# Patient Record
Sex: Female | Born: 1950 | Race: White | Hispanic: No | Marital: Married | Smoking: Never smoker
Health system: Southern US, Community
[De-identification: ages and names within clinical notes are randomized; demographics above are authoritative.]

## PROBLEM LIST (undated history)

## (undated) DIAGNOSIS — K635 Polyp of colon: Secondary | ICD-10-CM

## (undated) DIAGNOSIS — F329 Major depressive disorder, single episode, unspecified: Secondary | ICD-10-CM

## (undated) DIAGNOSIS — I2510 Atherosclerotic heart disease of native coronary artery without angina pectoris: Secondary | ICD-10-CM

## (undated) DIAGNOSIS — F32A Depression, unspecified: Secondary | ICD-10-CM

## (undated) DIAGNOSIS — G319 Degenerative disease of nervous system, unspecified: Secondary | ICD-10-CM

## (undated) DIAGNOSIS — E039 Hypothyroidism, unspecified: Secondary | ICD-10-CM

## (undated) DIAGNOSIS — R739 Hyperglycemia, unspecified: Secondary | ICD-10-CM

## (undated) DIAGNOSIS — E785 Hyperlipidemia, unspecified: Secondary | ICD-10-CM

## (undated) DIAGNOSIS — I10 Essential (primary) hypertension: Secondary | ICD-10-CM

## (undated) HISTORY — PX: CATARACT EXTRACTION: SUR2

## (undated) HISTORY — DX: Hyperlipidemia, unspecified: E78.5

## (undated) HISTORY — DX: Major depressive disorder, single episode, unspecified: F32.9

## (undated) HISTORY — DX: Hyperglycemia, unspecified: R73.9

## (undated) HISTORY — DX: Polyp of colon: K63.5

## (undated) HISTORY — DX: Depression, unspecified: F32.A

## (undated) HISTORY — DX: Atherosclerotic heart disease of native coronary artery without angina pectoris: I25.10

## (undated) HISTORY — DX: Hypothyroidism, unspecified: E03.9

## (undated) HISTORY — DX: Essential (primary) hypertension: I10

## (undated) HISTORY — DX: Degenerative disease of nervous system, unspecified: G31.9

---
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
## 1986-04-30 HISTORY — PX: NASAL SINUS SURGERY: SHX719

## 1993-04-30 HISTORY — PX: OTHER SURGICAL HISTORY: SHX169

## 2000-04-15 ENCOUNTER — Encounter: Payer: Self-pay | Admitting: Pediatrics

## 2000-04-15 ENCOUNTER — Encounter: Admission: RE | Admit: 2000-04-15 | Discharge: 2000-04-15 | Payer: Self-pay | Admitting: Pediatrics

## 2001-04-30 HISTORY — PX: CARDIAC CATHETERIZATION: SHX172

## 2001-07-30 ENCOUNTER — Ambulatory Visit (HOSPITAL_COMMUNITY): Admission: RE | Admit: 2001-07-30 | Discharge: 2001-07-30 | Payer: Self-pay | Admitting: *Deleted

## 2001-07-30 ENCOUNTER — Encounter: Payer: Self-pay | Admitting: *Deleted

## 2003-03-01 ENCOUNTER — Encounter: Admission: RE | Admit: 2003-03-01 | Discharge: 2003-03-01 | Payer: Self-pay | Admitting: Pediatrics

## 2003-05-01 DIAGNOSIS — K635 Polyp of colon: Secondary | ICD-10-CM

## 2003-05-01 HISTORY — DX: Polyp of colon: K63.5

## 2003-05-01 HISTORY — PX: CHOLECYSTECTOMY: SHX55

## 2003-11-24 ENCOUNTER — Ambulatory Visit (HOSPITAL_BASED_OUTPATIENT_CLINIC_OR_DEPARTMENT_OTHER): Admission: RE | Admit: 2003-11-24 | Discharge: 2003-11-24 | Payer: Self-pay | Admitting: Internal Medicine

## 2004-02-29 ENCOUNTER — Observation Stay (HOSPITAL_COMMUNITY): Admission: RE | Admit: 2004-02-29 | Discharge: 2004-03-01 | Payer: Self-pay

## 2004-02-29 ENCOUNTER — Encounter (INDEPENDENT_AMBULATORY_CARE_PROVIDER_SITE_OTHER): Payer: Self-pay | Admitting: *Deleted

## 2004-03-01 ENCOUNTER — Inpatient Hospital Stay (HOSPITAL_COMMUNITY): Admission: EM | Admit: 2004-03-01 | Discharge: 2004-03-02 | Payer: Self-pay | Admitting: Emergency Medicine

## 2004-04-30 HISTORY — PX: COLONOSCOPY W/ POLYPECTOMY: SHX1380

## 2004-07-17 ENCOUNTER — Ambulatory Visit: Payer: Self-pay | Admitting: Internal Medicine

## 2004-07-20 ENCOUNTER — Ambulatory Visit: Payer: Self-pay | Admitting: Internal Medicine

## 2004-11-10 ENCOUNTER — Ambulatory Visit: Payer: Self-pay | Admitting: Internal Medicine

## 2004-11-17 ENCOUNTER — Ambulatory Visit: Payer: Self-pay | Admitting: Internal Medicine

## 2004-11-22 ENCOUNTER — Encounter: Admission: RE | Admit: 2004-11-22 | Discharge: 2005-02-20 | Payer: Self-pay | Admitting: Internal Medicine

## 2005-07-24 ENCOUNTER — Ambulatory Visit: Payer: Self-pay | Admitting: Cardiology

## 2005-07-26 ENCOUNTER — Emergency Department (HOSPITAL_COMMUNITY): Admission: EM | Admit: 2005-07-26 | Discharge: 2005-07-26 | Payer: Self-pay | Admitting: Emergency Medicine

## 2005-08-09 ENCOUNTER — Encounter: Payer: Self-pay | Admitting: Cardiology

## 2005-08-09 ENCOUNTER — Ambulatory Visit: Payer: Self-pay

## 2005-08-13 ENCOUNTER — Ambulatory Visit: Payer: Self-pay | Admitting: Internal Medicine

## 2005-09-14 ENCOUNTER — Ambulatory Visit: Payer: Self-pay | Admitting: Internal Medicine

## 2005-10-01 ENCOUNTER — Ambulatory Visit: Payer: Self-pay | Admitting: Internal Medicine

## 2006-03-13 ENCOUNTER — Ambulatory Visit: Payer: Self-pay | Admitting: Internal Medicine

## 2006-03-13 LAB — CONVERTED CEMR LAB
ALT: 34 units/L (ref 0–40)
AST: 29 units/L (ref 0–37)
Albumin: 3.5 g/dL (ref 3.5–5.2)
Alkaline Phosphatase: 52 units/L (ref 39–117)
BUN: 19 mg/dL (ref 6–23)
Basophils Absolute: 0 10*3/uL (ref 0.0–0.1)
Basophils Relative: 0.3 % (ref 0.0–1.0)
Bilirubin Urine: NEGATIVE
CO2: 29 meq/L (ref 19–32)
Calcium: 9.1 mg/dL (ref 8.4–10.5)
Chloride: 107 meq/L (ref 96–112)
Chol/HDL Ratio, serum: 4.6
Cholesterol: 157 mg/dL (ref 0–200)
Creatinine, Ser: 0.9 mg/dL (ref 0.4–1.2)
Crystals: NEGATIVE
Eosinophil percent: 3.1 % (ref 0.0–5.0)
GFR calc non Af Amer: 69 mL/min
Glomerular Filtration Rate, Af Am: 84 mL/min/{1.73_m2}
Glucose, Bld: 113 mg/dL — ABNORMAL HIGH (ref 70–99)
HCT: 41.8 % (ref 36.0–46.0)
HDL: 34.1 mg/dL — ABNORMAL LOW (ref >39.0)
Hemoglobin, Urine: NEGATIVE
Hemoglobin: 13.8 g/dL (ref 12.0–15.0)
Hgb A1c MFr Bld: 5.5 % (ref 4.6–6.0)
Ketones, ur: NEGATIVE mg/dL
LDL Cholesterol: 95 mg/dL (ref 0–99)
Lymphocytes Relative: 37.1 % (ref 12.0–46.0)
MCHC: 33.1 g/dL (ref 30.0–36.0)
MCV: 88.4 fL (ref 78.0–100.0)
Monocytes Absolute: 0.5 10*3/uL (ref 0.2–0.7)
Monocytes Relative: 9.9 % (ref 3.0–11.0)
Mucus, UA: NEGATIVE
Neutro Abs: 2.4 10*3/uL (ref 1.4–7.7)
Neutrophils Relative %: 49.6 % (ref 43.0–77.0)
Nitrite: NEGATIVE
Platelets: 204 10*3/uL (ref 150–400)
Potassium: 4 meq/L (ref 3.5–5.1)
RBC / HPF: NONE SEEN
RBC: 4.73 M/uL (ref 3.87–5.11)
RDW: 13.2 % (ref 11.5–14.6)
Sodium: 141 meq/L (ref 135–145)
Specific Gravity, Urine: >=1.03 (ref 1.000–1.03)
TSH: 5.77 microintl units/mL — ABNORMAL HIGH (ref 0.35–5.50)
Total Bilirubin: 0.6 mg/dL (ref 0.3–1.2)
Total Protein, Urine: NEGATIVE mg/dL
Total Protein: 6.6 g/dL (ref 6.0–8.3)
Triglyceride fasting, serum: 138 mg/dL (ref 0–149)
Urine Glucose: NEGATIVE mg/dL
Urobilinogen, UA: 0.2 (ref 0.0–1.0)
VLDL: 28 mg/dL (ref 0–40)
WBC: 5 10*3/uL (ref 4.5–10.5)
pH: 5.5 (ref 5.0–8.0)

## 2006-04-01 ENCOUNTER — Ambulatory Visit: Payer: Self-pay | Admitting: Internal Medicine

## 2006-09-27 ENCOUNTER — Ambulatory Visit: Payer: Self-pay | Admitting: Cardiology

## 2006-10-15 ENCOUNTER — Telehealth (INDEPENDENT_AMBULATORY_CARE_PROVIDER_SITE_OTHER): Payer: Self-pay | Admitting: *Deleted

## 2006-10-25 ENCOUNTER — Ambulatory Visit: Payer: Self-pay | Admitting: Internal Medicine

## 2007-02-04 DIAGNOSIS — F329 Major depressive disorder, single episode, unspecified: Secondary | ICD-10-CM

## 2007-02-06 ENCOUNTER — Ambulatory Visit: Payer: Self-pay | Admitting: Internal Medicine

## 2007-02-06 DIAGNOSIS — I10 Essential (primary) hypertension: Secondary | ICD-10-CM | POA: Insufficient documentation

## 2007-02-06 DIAGNOSIS — I2510 Atherosclerotic heart disease of native coronary artery without angina pectoris: Secondary | ICD-10-CM | POA: Insufficient documentation

## 2007-02-06 DIAGNOSIS — E782 Mixed hyperlipidemia: Secondary | ICD-10-CM | POA: Insufficient documentation

## 2007-02-06 DIAGNOSIS — E039 Hypothyroidism, unspecified: Secondary | ICD-10-CM | POA: Insufficient documentation

## 2007-02-07 ENCOUNTER — Encounter: Payer: Self-pay | Admitting: Internal Medicine

## 2007-02-07 LAB — CONVERTED CEMR LAB
Creatinine, Urine: 251.3 mg/dL
Microalb Creat Ratio: 3.5 mg/g (ref 0.0–30.0)
Microalb, Ur: 0.88 mg/dL (ref 0.00–1.89)

## 2007-02-09 LAB — CONVERTED CEMR LAB
ALT: 61 units/L — ABNORMAL HIGH (ref 0–35)
AST: 50 units/L — ABNORMAL HIGH (ref 0–37)
Albumin: 3.9 g/dL (ref 3.5–5.2)
Alkaline Phosphatase: 64 units/L (ref 39–117)
BUN: 16 mg/dL (ref 6–23)
Basophils Absolute: 0 10*3/uL (ref 0.0–0.1)
Basophils Relative: 0.5 % (ref 0.0–1.0)
Bilirubin, Direct: 0.1 mg/dL (ref 0.0–0.3)
CO2: 29 meq/L (ref 19–32)
Calcium: 9.7 mg/dL (ref 8.4–10.5)
Chloride: 105 meq/L (ref 96–112)
Cholesterol: 142 mg/dL (ref 0–200)
Creatinine, Ser: 0.9 mg/dL (ref 0.4–1.2)
Eosinophils Absolute: 0.2 10*3/uL (ref 0.0–0.6)
Eosinophils Relative: 2.8 % (ref 0.0–5.0)
GFR calc Af Amer: 83 mL/min
GFR calc non Af Amer: 69 mL/min
Glucose, Bld: 89 mg/dL (ref 70–99)
HCT: 42 % (ref 36.0–46.0)
HDL: 33 mg/dL — ABNORMAL LOW (ref >39.0)
Hemoglobin: 14.4 g/dL (ref 12.0–15.0)
Hgb A1c MFr Bld: 5.7 % (ref 4.6–6.0)
LDL Cholesterol: 82 mg/dL (ref 0–99)
Lymphocytes Relative: 30 % (ref 12.0–46.0)
MCHC: 34.2 g/dL (ref 30.0–36.0)
MCV: 87 fL (ref 78.0–100.0)
Monocytes Absolute: 0.4 10*3/uL (ref 0.2–0.7)
Monocytes Relative: 7.5 % (ref 3.0–11.0)
Neutro Abs: 3.4 10*3/uL (ref 1.4–7.7)
Neutrophils Relative %: 59.2 % (ref 43.0–77.0)
Platelets: 237 10*3/uL (ref 150–400)
Potassium: 3.9 meq/L (ref 3.5–5.1)
RBC: 4.82 M/uL (ref 3.87–5.11)
RDW: 13.5 % (ref 11.5–14.6)
Sodium: 141 meq/L (ref 135–145)
TSH: 2.78 microintl units/mL (ref 0.35–5.50)
Total Bilirubin: 0.7 mg/dL (ref 0.3–1.2)
Total CHOL/HDL Ratio: 4.3
Total Protein: 7.5 g/dL (ref 6.0–8.3)
Triglycerides: 137 mg/dL (ref 0–149)
VLDL: 27 mg/dL (ref 0–40)
WBC: 5.7 10*3/uL (ref 4.5–10.5)

## 2007-02-10 ENCOUNTER — Encounter (INDEPENDENT_AMBULATORY_CARE_PROVIDER_SITE_OTHER): Payer: Self-pay | Admitting: *Deleted

## 2007-03-14 ENCOUNTER — Ambulatory Visit: Payer: Self-pay | Admitting: Internal Medicine

## 2007-03-14 LAB — CONVERTED CEMR LAB
ALT: 55 units/L — ABNORMAL HIGH (ref 0–35)
AST: 50 units/L — ABNORMAL HIGH (ref 0–37)
Albumin: 3.9 g/dL (ref 3.5–5.2)
Alkaline Phosphatase: 66 units/L (ref 39–117)
Bilirubin, Direct: 0.1 mg/dL (ref 0.0–0.3)
GGT: 29 units/L (ref 7–51)
Total Bilirubin: 0.6 mg/dL (ref 0.3–1.2)
Total Protein: 7.6 g/dL (ref 6.0–8.3)

## 2007-04-04 ENCOUNTER — Encounter: Payer: Self-pay | Admitting: Internal Medicine

## 2007-04-04 LAB — CONVERTED CEMR LAB
ALT: 61 units/L — ABNORMAL HIGH (ref 0–35)
AST: 64 units/L — ABNORMAL HIGH (ref 0–37)

## 2007-04-09 ENCOUNTER — Ambulatory Visit: Payer: Self-pay | Admitting: Internal Medicine

## 2007-04-09 LAB — CONVERTED CEMR LAB
ALT: 50 units/L — ABNORMAL HIGH (ref 0–35)
AST: 46 units/L — ABNORMAL HIGH (ref 0–37)
Albumin: 3.5 g/dL (ref 3.5–5.2)
Alkaline Phosphatase: 71 units/L (ref 39–117)
Bilirubin, Direct: 0.1 mg/dL (ref 0.0–0.3)
Ferritin: 22.4 ng/mL (ref 10.0–291.0)
Total Bilirubin: 0.6 mg/dL (ref 0.3–1.2)
Total Protein: 7 g/dL (ref 6.0–8.3)

## 2007-05-06 ENCOUNTER — Encounter: Payer: Self-pay | Admitting: Internal Medicine

## 2007-05-07 ENCOUNTER — Encounter: Admission: RE | Admit: 2007-05-07 | Discharge: 2007-05-07 | Payer: Self-pay | Admitting: Gastroenterology

## 2007-06-09 ENCOUNTER — Ambulatory Visit: Payer: Self-pay | Admitting: Internal Medicine

## 2007-06-19 ENCOUNTER — Encounter (INDEPENDENT_AMBULATORY_CARE_PROVIDER_SITE_OTHER): Payer: Self-pay | Admitting: *Deleted

## 2007-06-19 LAB — CONVERTED CEMR LAB
ALT: 31 units/L (ref 0–35)
AST: 29 units/L (ref 0–37)
Albumin: 3.3 g/dL — ABNORMAL LOW (ref 3.5–5.2)
Alkaline Phosphatase: 70 units/L (ref 39–117)
Bilirubin, Direct: 0.1 mg/dL (ref 0.0–0.3)
Total Bilirubin: 0.6 mg/dL (ref 0.3–1.2)
Total Protein: 6.7 g/dL (ref 6.0–8.3)

## 2007-09-09 ENCOUNTER — Ambulatory Visit: Payer: Self-pay | Admitting: Internal Medicine

## 2007-09-23 ENCOUNTER — Telehealth (INDEPENDENT_AMBULATORY_CARE_PROVIDER_SITE_OTHER): Payer: Self-pay | Admitting: *Deleted

## 2007-10-03 ENCOUNTER — Ambulatory Visit: Payer: Self-pay | Admitting: Internal Medicine

## 2007-10-27 ENCOUNTER — Ambulatory Visit: Payer: Self-pay | Admitting: Internal Medicine

## 2007-10-27 LAB — CONVERTED CEMR LAB: ALT: 51 units/L — ABNORMAL HIGH (ref 0–35)

## 2008-01-19 ENCOUNTER — Ambulatory Visit: Payer: Self-pay | Admitting: Internal Medicine

## 2008-01-19 LAB — CONVERTED CEMR LAB
ALT: 34 units/L (ref 0–35)
AST: 34 units/L (ref 0–37)

## 2008-03-22 ENCOUNTER — Ambulatory Visit: Payer: Self-pay | Admitting: Internal Medicine

## 2008-03-22 LAB — CONVERTED CEMR LAB: AST: 30 units/L (ref 0–37)

## 2008-04-29 ENCOUNTER — Ambulatory Visit: Payer: Self-pay | Admitting: Internal Medicine

## 2008-05-03 ENCOUNTER — Encounter (INDEPENDENT_AMBULATORY_CARE_PROVIDER_SITE_OTHER): Payer: Self-pay | Admitting: *Deleted

## 2008-07-27 ENCOUNTER — Telehealth (INDEPENDENT_AMBULATORY_CARE_PROVIDER_SITE_OTHER): Payer: Self-pay | Admitting: *Deleted

## 2008-09-03 ENCOUNTER — Telehealth (INDEPENDENT_AMBULATORY_CARE_PROVIDER_SITE_OTHER): Payer: Self-pay | Admitting: *Deleted

## 2008-09-29 ENCOUNTER — Telehealth (INDEPENDENT_AMBULATORY_CARE_PROVIDER_SITE_OTHER): Payer: Self-pay | Admitting: *Deleted

## 2008-09-29 ENCOUNTER — Encounter (INDEPENDENT_AMBULATORY_CARE_PROVIDER_SITE_OTHER): Payer: Self-pay | Admitting: *Deleted

## 2008-10-04 ENCOUNTER — Telehealth (INDEPENDENT_AMBULATORY_CARE_PROVIDER_SITE_OTHER): Payer: Self-pay | Admitting: *Deleted

## 2008-10-08 ENCOUNTER — Telehealth (INDEPENDENT_AMBULATORY_CARE_PROVIDER_SITE_OTHER): Payer: Self-pay | Admitting: *Deleted

## 2008-11-05 ENCOUNTER — Telehealth (INDEPENDENT_AMBULATORY_CARE_PROVIDER_SITE_OTHER): Payer: Self-pay | Admitting: *Deleted

## 2008-12-06 ENCOUNTER — Telehealth (INDEPENDENT_AMBULATORY_CARE_PROVIDER_SITE_OTHER): Payer: Self-pay | Admitting: *Deleted

## 2008-12-21 ENCOUNTER — Encounter (INDEPENDENT_AMBULATORY_CARE_PROVIDER_SITE_OTHER): Payer: Self-pay | Admitting: *Deleted

## 2008-12-23 ENCOUNTER — Ambulatory Visit: Payer: Self-pay | Admitting: Internal Medicine

## 2008-12-23 DIAGNOSIS — R7309 Other abnormal glucose: Secondary | ICD-10-CM | POA: Insufficient documentation

## 2008-12-23 DIAGNOSIS — Z860100 Personal history of colon polyps, unspecified: Secondary | ICD-10-CM | POA: Insufficient documentation

## 2009-01-04 ENCOUNTER — Encounter (INDEPENDENT_AMBULATORY_CARE_PROVIDER_SITE_OTHER): Payer: Self-pay | Admitting: *Deleted

## 2009-01-04 LAB — CONVERTED CEMR LAB
ALT: 24 units/L (ref 0–35)
AST: 27 units/L (ref 0–37)
Albumin: 4 g/dL (ref 3.5–5.2)
Alkaline Phosphatase: 59 units/L (ref 39–117)
BUN: 17 mg/dL (ref 6–23)
Basophils Absolute: 0 10*3/uL (ref 0.0–0.1)
Basophils Relative: 0.4 % (ref 0.0–3.0)
Bilirubin, Direct: 0 mg/dL (ref 0.0–0.3)
CO2: 32 meq/L (ref 19–32)
Calcium: 9.2 mg/dL (ref 8.4–10.5)
Chloride: 103 meq/L (ref 96–112)
Cholesterol: 108 mg/dL (ref 0–200)
Creatinine, Ser: 1 mg/dL (ref 0.4–1.2)
Eosinophils Absolute: 0.1 10*3/uL (ref 0.0–0.7)
Eosinophils Relative: 2.5 % (ref 0.0–5.0)
GFR calc non Af Amer: 60.54 mL/min (ref >60)
Glucose, Bld: 92 mg/dL (ref 70–99)
HCT: 43.9 % (ref 36.0–46.0)
HDL: 34.7 mg/dL — ABNORMAL LOW (ref >39.00)
Hemoglobin: 15 g/dL (ref 12.0–15.0)
Hgb A1c MFr Bld: 5.7 % (ref 4.6–6.5)
LDL Cholesterol: 50 mg/dL (ref 0–99)
Lymphocytes Relative: 38.4 % (ref 12.0–46.0)
Lymphs Abs: 1.9 10*3/uL (ref 0.7–4.0)
MCHC: 34.1 g/dL (ref 30.0–36.0)
MCV: 89.1 fL (ref 78.0–100.0)
Monocytes Absolute: 0.3 10*3/uL (ref 0.1–1.0)
Monocytes Relative: 6.6 % (ref 3.0–12.0)
Neutro Abs: 2.6 10*3/uL (ref 1.4–7.7)
Neutrophils Relative %: 52.1 % (ref 43.0–77.0)
Platelets: 179 10*3/uL (ref 150.0–400.0)
Potassium: 3.8 meq/L (ref 3.5–5.1)
RBC: 4.92 M/uL (ref 3.87–5.11)
RDW: 13 % (ref 11.5–14.6)
Sodium: 140 meq/L (ref 135–145)
TSH: 2.17 microintl units/mL (ref 0.35–5.50)
Total Bilirubin: 0.8 mg/dL (ref 0.3–1.2)
Total CHOL/HDL Ratio: 3
Total Protein: 7.5 g/dL (ref 6.0–8.3)
Triglycerides: 118 mg/dL (ref 0.0–149.0)
VLDL: 23.6 mg/dL (ref 0.0–40.0)
WBC: 4.9 10*3/uL (ref 4.5–10.5)

## 2009-02-21 ENCOUNTER — Ambulatory Visit: Payer: Self-pay | Admitting: Internal Medicine

## 2009-03-16 ENCOUNTER — Encounter: Payer: Self-pay | Admitting: Family Medicine

## 2009-03-22 ENCOUNTER — Encounter: Payer: Self-pay | Admitting: Family Medicine

## 2009-03-22 LAB — CONVERTED CEMR LAB: Tissue Transglutaminase Ab, IgA: 0.5 units (ref <7)

## 2009-08-30 ENCOUNTER — Telehealth: Payer: Self-pay | Admitting: Internal Medicine

## 2010-03-27 ENCOUNTER — Telehealth (INDEPENDENT_AMBULATORY_CARE_PROVIDER_SITE_OTHER): Payer: Self-pay | Admitting: *Deleted

## 2010-05-01 ENCOUNTER — Emergency Department (HOSPITAL_COMMUNITY)
Admission: EM | Admit: 2010-05-01 | Discharge: 2010-05-01 | Payer: Self-pay | Source: Home / Self Care | Admitting: Family Medicine

## 2010-05-19 ENCOUNTER — Ambulatory Visit
Admission: RE | Admit: 2010-05-19 | Discharge: 2010-05-19 | Payer: Self-pay | Source: Home / Self Care | Attending: Internal Medicine | Admitting: Internal Medicine

## 2010-05-24 ENCOUNTER — Telehealth: Payer: Self-pay | Admitting: Internal Medicine

## 2010-05-29 ENCOUNTER — Encounter: Payer: Self-pay | Admitting: Internal Medicine

## 2010-05-30 NOTE — Progress Notes (Signed)
Summary: Dizzy,heart flutter   Phone Note Call from Patient Call back at Work Phone [phone]   Caller: Patient Summary of Call: Pt dizzy having heart flutter. Initial call taken by: Judie Grieve,  Aug 30, 2009 8:44 AM  Follow-up for Phone Call        Had pt check BP & HR.  Result  101/66,  HR 71.  Described being dizzy and feeling her heart flutter this morning when she got up as well as her chest felt weak but no pain; had the dizziness & heart flutter about a week ago for first time.  Did not feel heart flutter during our conversation but did feel dizzy.  Also has been having SOB more than usual for about a month now.  She's lost approx. 25 pounds recently.  Stated she's been eating and drinking enough. Told her to keep an eye on it and call us back towards the end of the week.  Keep taking BP & record it.  Call us if symptoms persist or worsen.  She verbalized understanding. Follow-up by: Minerva Areola, RN, BSN,  Aug 30, 2009 9:29 AM     Appended Document: Dizzy,heart flutter Patient seen in october.  It may be time for return. If weight loss was not intentional she should see primary  Appended Document: Dizzy,heart flutter Called patient .. she.advised me that  she saw her md at work and she had fluid in her ears which she states they took care of and now she has no complaints. Her wight is 210 lbs which is the same that it was in August of 2010. She is in call backs for the fall and will let me know if she needed to be seen sooner. No current complaints of dizziness or palps. Will let Dr. Tenny Craw know.

## 2010-05-30 NOTE — Progress Notes (Signed)
Summary: refill  Phone Note Refill Request Call back at Work Phone [phone] Message from:  Patient  Refills Requested: Medication #1:  BENAZEPRIL HCL 5 MG  TABS **APPOINTMENT DUE** 1 by mouth once daily  Medication #2:  FUROSEMIDE 40 MG  TABS **APPOINTMENT DUE**1 by mouth once daily  Medication #3:  LEVOTHYROXINE SODIUM 25 MCG  TABS 1 by mouth once daily **APPOINTMENT DUE**  Medication #4:  METOPROLOL TARTRATE 100 MG  TABS 1/4 tab qd [facility] -patient changing pharmacy -patient wants 90 day supply -    Next Appointment Scheduled: [id number] cpx Initial call taken by: Okey Regal [place],  March 27, 2010 10:01 AM    Prescriptions: METOPROLOL TARTRATE 100 MG  TABS (METOPROLOL TARTRATE) 1/4 tab qd  #90 x 0   Entered by:   Shonna Chock CMA   Authorized by:   Marga Melnick MD   Signed by:   Shonna Chock CMA on 03/27/2010   Method used:   Electronically to        [facility]  [phone]* (retail)       [address]       [place], [place]  [address]       Ph: [phone] or [phone]       Fax: [phone]   RxID:   [id number] LEVOTHYROXINE SODIUM 25 MCG  TABS (LEVOTHYROXINE SODIUM) 1 by mouth once daily **APPOINTMENT DUE**  #90 x 0   Entered by:   Shonna Chock CMA   Authorized by:   Marga Melnick MD   Signed by:   Shonna Chock CMA on 03/27/2010   Method used:   Electronically to        [facility]  [phone]* (retail)       [address]       [place], [place]  [address]       Ph: [phone] or [phone]       Fax: [phone]   RxID:   [id number] FUROSEMIDE 40 MG  TABS (FUROSEMIDE) **APPOINTMENT DUE**1 by mouth once daily  #90 x 0   Entered by:   Shonna Chock CMA   Authorized by:   Marga Melnick MD   Signed by:   Shonna Chock CMA on 03/27/2010   Method used:   Electronically to        [facility]  [phone]* (retail)       [address]       [place], [place]  [address]       Ph: [phone] or [phone]       Fax: [phone]   RxID:   [id number] BENAZEPRIL HCL 5 MG  TABS (BENAZEPRIL HCL) **APPOINTMENT DUE** 1 by mouth once daily  #90 x 0   Entered by:   Shonna Chock CMA   Authorized by:   Marga Melnick MD   Signed by:   Shonna Chock CMA on 03/27/2010   Method used:   Electronically to        [facility]  [phone]* (retail)       [address]       [place], [place]  [address]       Ph: [phone] or [phone]       Fax: [phone]   RxID:   [id number]

## 2010-06-01 NOTE — Assessment & Plan Note (Signed)
Summary: f1y  Medications Added CITALOPRAM HYDROBROMIDE 40 MG  TABS (CITALOPRAM HYDROBROMIDE) 1 tab daily      Allergies Added:   Visit Type:  Follow-up Primary Provider:  Marga Melnick MD  CC:  no complaints.  History of Present Illness: Ms. Kathleen Sellers is a 60 year old woman with a history of CAD(cath 2003:  50 to 60% LAD; 60% OM2; 20% RCA),  dyslipidemia, hypothyroidism, hypertension, I last saw her in October 2010.  At that time she was doing well.  Lipid panel in Aug 2010 LDL was 50, HDL was 35. Since seen she has done well from a cardiac stanpoint. She denies SOB, CP, Dizziness, palptitations. She hurt her foot a couple wks ago.  Note her mother died this fall and a friend died at thanksgiving.  Problems Prior to Update: 1)  Routine General Medical Exam@health  Care Facl  (ICD-V70.0) 2)  Fasting Hyperglycemia  (ICD-790.29) 3)  Colonic Polyps, Hx of  (ICD-V12.72) 4)  C A D  (ICD-414.00) 5)  Hypertension, Essential Nos  (ICD-401.9) 6)  Hyperlipidemia  (ICD-272.2) 7)  Hypothyroidism  (ICD-244.9) 8)  Family History Diabetes 1st Degree Relative  (ICD-V18.0) 9)  Obsessive-compulsive Disorder  (ICD-300.3) 10)  Depression  (ICD-311)  Current Medications (verified): 1)  Lorazepam 1 Mg  Tabs (Lorazepam) .Marland Kitchen.. 1 By Mouth Bid 2)  Benazepril Hcl 5 Mg  Tabs (Benazepril Hcl) .... **appointment Due** 1 By Mouth Once Daily 3)  Citalopram Hydrobromide 40 Mg  Tabs (Citalopram Hydrobromide) .Marland Kitchen.. 1 Tab Daily 4)  Levothyroxine Sodium 25 Mcg  Tabs (Levothyroxine Sodium) .Marland Kitchen.. 1 By Mouth Once Daily **appointment Due** 5)  Furosemide 40 Mg  Tabs (Furosemide) .... **appointment Due**1 By Mouth Once Daily 6)  Metoprolol Tartrate 100 Mg  Tabs (Metoprolol Tartrate) .... 1/4 Tab Qd 7)  Omeprazole 20 Mg  Tbec (Omeprazole) .Marland Kitchen.. 1 By Mouth Qd 8)  Crestor 40 Mg Tabs (Rosuvastatin Calcium) .... Once Daily  Allergies (verified): 1)  ! Sulfa 2)  ! Septra 3)  ! * Raniclor  Past History:  Family  History: Last updated: 2009/01/04 Father: died of cerebral  hemorrhage Mother:   DM, HTN,bone CA Paternal uncles with CAD; MGM breast CA  Social History: Last updated: January 04, 2009 restricts "junk" Married.  3 kids No Tobacco.  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  Past Medical History: Reviewed history from Jan 04, 2009 and no changes required. Fasting hyperglycemia (790.29) C A D (ICD-414.00) @cath  2003 HYPERTENSION, ESSENTIAL NOS (ICD-401.9) HYPOTHYROIDISM (ICD-244.9) HYPERLIPIDEMIA (ICD-272.2) OBSESSIVE-COMPULSIVE DISORDER (ICD-300.3) HYPERLIPIDEMIA (ICD-272.4) DEPRESSION (ICD-311), Dr Nolen Mu Colonic polyps, hx of  Past Surgical History: Reviewed history from Jan 04, 2009 and no changes required. Oophorectomy /benign tumor 1970 Appendectomy Cataract extraction bilaterally Cholecystectomy 2005 Colon polypectomy 2006, Dr Evette Cristal Hysterectomy ,dysfunctional menses 1995 Sinus surgery 1988  Family History: Reviewed history from 01-04-2009 and no changes required. Father: died of cerebral  hemorrhage Mother:   DM, HTN,bone CA Paternal uncles with CAD; MGM breast CA  Social History: Reviewed history from 01-04-2009 and no changes required. restricts "junk" Married.  3 kids No Tobacco.  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  ALl systems neg to the above problem except as noted above  Vital Signs:  Patient profile:   60 year old female Height:  64 inches Weight:      212.75 pounds BMI:     36.65 Pulse rate:   50 / minute BP sitting:   106 / 66  (left arm) Cuff size:   regular  Vitals Entered By: Micki Riley CNA (May 19, 2010 1:54  PM)  Physical Exam  Additional Exam:  Patient is in NAD HEENT:  Normocephalic, atraumatic. EOMI, PERRLA.  Neck: JVP is normal. No thyromegaly. No bruits.  Lungs: clear to auscultation. No rales no wheezes.  Heart: Regular rate and rhythm. Normal S1, S2. No S3.   No significant murmurs. PMI not displaced.  Abdomen:  Supple, nontender. Normal bowel sounds. No masses. No hepatomegaly.  Extremities:   Good distal pulses throughout. No lower extremity edema. R foot in boot, bandaged. Musculoskeletal :moving all extremities.  Neuro:   alert and oriented x3.    Impression & Recommendations:  Problem # 1:  C A D (ICD-414.00) No symptoms to suggest angina.  NO change in meds.  Problem # 2:  HYPERTENSION, ESSENTIAL NOS (ICD-401.9) adequate control Patient will have labs in a few wks.  Problem # 3:  HYPERLIPIDEMIA (ICD-272.2) Patient will have labs when she sees [facility].  Told her to forward them here. Encouraged increased exercise. Her updated medication list for this problem includes:    Crestor 40 Mg Tabs (Rosuvastatin calcium) ..... Once daily  Problem # 4:  HYPOTHYROIDISM (ICD-244.9) Will get labs. Her updated medication list for this problem includes:    Levothyroxine Sodium 25 Mcg Tabs (Levothyroxine sodium) .Marland Kitchen... 1 by mouth once daily **appointment due**  Patient Instructions: 1)  Continue current meds 2)  Increase excercise 3)  F/U in 1 year. 4)  Will review labs when you have them done.

## 2010-06-01 NOTE — Progress Notes (Signed)
Summary: CPX Labs  Phone Note Call from Patient Call back at [phone]   Caller: Patient Summary of Call: Pt is requesting to have cpx labs done at [facility].  Pt has appt for cpx 2/3 and would like to have the labs done there prior to cpx visit. Please contact patient at [phone] w/orders or instructions on what she needs to do. Initial call taken by: Lavell Islam,  May 24, 2010 11:09 AM  Follow-up for Phone Call        Do you want cbc diff, bmet, tsh, vit d ? dx v70.0. Lucious Groves CMA  May 24, 2010 11:36 AM   Additional Follow-up for Phone Call Additional follow up Details #1::        V70.0, 272.4, 244.9, 790.29 ( lipids, BMET, TSH, CBC& dif, A1c, hepatic panel).  Additional Follow-up by: Marga Melnick MD,  May 25, 2010 10:05 AM    Additional Follow-up for Phone Call Additional follow up Details #2::    Patient notified and will call back with a  fax # for me. Lucious Groves CMA  May 25, 2010 2:36 PM   Faxed to [facility] at [phone]. Lucious Groves CMA  May 25, 2010 2:52 PM   New/Updated Medications: * LABS TO BE DRAWN Fasting-- lipids, BMET, TSH, CBC& dif, A1c, hepatic panel  dx: V70.0, 272.4, 244.9, 790.29 Prescriptions: LABS TO BE DRAWN Fasting-- lipids, BMET, TSH, CBC& dif, A1c, hepatic panel  dx: V70.0, 272.4, 244.9, 790.29  #1 x 0   Entered by:   Lucious Groves CMA   Authorized by:   Marga Melnick MD   Signed by:   Lucious Groves CMA on 05/25/2010   Method used:   Print then Give to Patient   RxID:   [id number]

## 2010-06-02 ENCOUNTER — Encounter: Payer: Self-pay | Admitting: Internal Medicine

## 2010-06-02 ENCOUNTER — Ambulatory Visit: Admit: 2010-06-02 | Payer: Self-pay | Admitting: Internal Medicine

## 2010-06-02 ENCOUNTER — Encounter (INDEPENDENT_AMBULATORY_CARE_PROVIDER_SITE_OTHER): Payer: Commercial Managed Care - PPO | Admitting: Internal Medicine

## 2010-06-02 DIAGNOSIS — E039 Hypothyroidism, unspecified: Secondary | ICD-10-CM

## 2010-06-02 DIAGNOSIS — I2510 Atherosclerotic heart disease of native coronary artery without angina pectoris: Secondary | ICD-10-CM

## 2010-06-02 DIAGNOSIS — Z136 Encounter for screening for cardiovascular disorders: Secondary | ICD-10-CM

## 2010-06-02 DIAGNOSIS — J019 Acute sinusitis, unspecified: Secondary | ICD-10-CM | POA: Insufficient documentation

## 2010-06-02 DIAGNOSIS — E782 Mixed hyperlipidemia: Secondary | ICD-10-CM

## 2010-06-02 DIAGNOSIS — Z0000 Encounter for general adult medical examination without abnormal findings: Secondary | ICD-10-CM

## 2010-06-02 DIAGNOSIS — I10 Essential (primary) hypertension: Secondary | ICD-10-CM

## 2010-06-02 DIAGNOSIS — Z86010 Personal history of colonic polyps: Secondary | ICD-10-CM

## 2010-06-07 NOTE — Assessment & Plan Note (Signed)
Summary: cpx/kn-----pt says she got phone call to move appt to 9am, bu...   Vital Signs:  Patient profile:   60 year old female Height:      64 inches Weight:      214.0 pounds BMI:     36.87 Temp:     98.6 degrees F oral Pulse rate:   72 / minute Resp:     14 per minute BP sitting:   118 / 70  (left arm) Cuff size:   large  Vitals Entered By: Shonna Chock CMA (June 02, 2010 9:12 AM)  CC: CPX and discuss labs (copy given), General Medical Evaluation, URI symptoms, Heartburn   Primary Care Provider:  Marga Melnick MD  CC:  CPX and discuss labs (copy given), General Medical Evaluation, URI symptoms, and Heartburn.  History of Present Illness:    Kathleen Sellers is here for a physical; she has had  active URI symptoms X 2 weeks.She now  reports nasal congestion and purulent nasal discharge, but denies productive cough and earache.  The patient denies fever, dyspnea, and wheezing.  The patient denies frontal  headache, bilateral facial pain, tooth pain, and tender adenopathy.  Rx: none.    Hyperlipidemia Follow-Up: The patient denies muscle aches, GI upset, abdominal pain, flushing, itching, constipation,  & diarrhea..  Other symptoms include palpitations( occasional fluttering @ rest).  The patient denies the following symptoms: chest pain/pressure, dypsnea, syncope, and pedal edema.  Compliance with medications (by patient report) has been near 100%.  Dietary compliance has been poor.  Adjunctive measures currently used by the patient include fiber and ASA.  LDL is 54.     Hypertension Follow-Up:   The patient denies lightheadedness, urinary frequency, and headaches.  Compliance with medications (by patient report) has been near 100%.  Adjunctive measures currently used by the patient include salt restriction.  BP averages 120/80.     GERD:  The patient denies acid reflux, sour taste in mouth, epigastric pain, trouble swallowing, weight loss, and weight gain if she takes the PPI.  The  patient denies the following alarm features: melena, dysphagia, hematemesis, and vomiting.  Symptoms are worse with spicy foods.  The patient has found the following treatments to be effective: a PPI.    Current Medications (verified): 1)  Lorazepam 1 Mg  Tabs (Lorazepam) .Marland Kitchen.. 1 By Mouth Bid 2)  Benazepril Hcl 5 Mg  Tabs (Benazepril Hcl) .Marland Kitchen.. 1 By Mouth Once Daily 3)  Citalopram Hydrobromide 40 Mg  Tabs (Citalopram Hydrobromide) .Marland Kitchen.. 1 Tab Daily 4)  Levothyroxine Sodium 25 Mcg  Tabs (Levothyroxine Sodium) .Marland Kitchen.. 1 By Mouth Once Daily 5)  Furosemide 40 Mg  Tabs (Furosemide) .Marland Kitchen.. 1 By Mouth Once Daily 6)  Metoprolol Tartrate 100 Mg  Tabs (Metoprolol Tartrate) .... 1/4 Tab Qd 7)  Omeprazole 20 Mg  Tbec (Omeprazole) .Marland Kitchen.. 1 By Mouth Qd 8)  Crestor 40 Mg Tabs (Rosuvastatin Calcium) .... Once Daily  Allergies: 1)  ! Sulfa 2)  ! Septra 3)  ! * Raniclor  Past History:  Past Medical History: Fasting hyperglycemia (790.29), PMH of  C A D (ICD-414.00) @ catheterization  2003 HYPERTENSION, ESSENTIAL NOS (ICD-401.9) HYPOTHYROIDISM (ICD-244.9) HYPERLIPIDEMIA (ICD-272.2) DEPRESSION (ICD-311), Dr Nolen Mu Colonic polyps, PMH  of  Past Surgical History: Oophorectomy  for benign tumor 1970 Appendectomy Cataract extraction bilaterally Cholecystectomy 2005 Colon polypectomy 2006, Dr Jeralene Peters; ? was due 2011 Hysterectomy & USO  ,dysfunctional menses 1995 Sinus surgery 1988, Dr Haroldine Laws  Family History: Father: died of cerebral  hemorrhage Mother:   DM, HTN, lung cacer with bone  metastases  Paternal uncles: CAD; MGM: breast cancer  Social History: Married.  3  children No Tobacco.  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 from lack of exercise. Eyes:  Denies blurring, double vision, and vision loss-both eyes. ENT:  Denies hoarseness. GU:  Denies discharge, dysuria, and hematuria. MS:   Complains of muscle aches; denies joint pain, joint redness, and joint swelling. Derm:  Denies changes in nail beds, dryness, and hair loss. Neuro:  Complains of numbness; denies tingling; Intermittent numbness 3rd-5th L fingers. Psych:  Complains of easily angered and easily tearful; denies anxiety, depression, and irritability; her motherf died in 2010/01/30. Endo:  Complains of heat intolerance; denies cold intolerance, excessive hunger, excessive thirst, and excessive urination. Heme:  Denies abnormal bruising and bleeding. Allergy:  Denies itching eyes and sneezing.  Physical Exam  General:  well-nourished,in no acute distress; alert,appropriate and cooperative throughout examination Head:  Normocephalic and atraumatic ; small subcutaneous inclusion cyst over crown Eyes:  No corneal or conjunctival inflammation noted. Perrla. Funduscopic exam benign, without hemorrhages, exudates or papilledema.  Ears:  External ear exam shows no significant lesions or deformities.  Otoscopic examination reveals clear canals, tympanic membranes are intact bilaterally without bulging, retraction, inflammation or discharge. Hearing is grossly normal bilaterally. Nose:  External nasal examination shows no deformity or inflammation. Nasal mucosa are pink and moist without lesions or exudates. Mouth:  Oral mucosa and oropharynx without lesions or exudates.  Teeth in good repair. Neck:  No deformities, masses, or tenderness noted. Breasts:  Mammograms overdue, risk discussed Lungs:  Normal respiratory effort, chest expands symmetrically. Lungs are clear to auscultation, no crackles or wheezes. Heart:  normal rate, regular rhythm, no gallop, no rub, no JVD, no HJR, and grade 1/2  /6 systolic murmur @ R  base.   Abdomen:  Bowel sounds positive,abdomen soft and non-tender without masses, organomegaly or hernias noted. Genitalia:  Dr Elana Alm Msk:  No deformity or scoliosis noted of thoracic or lumbar spine.   Pulses:   R and L carotid,radial,dorsalis pedis and posterior tibial pulses are full and equal bilaterally Extremities:  No clubbing, cyanosis, edema, or deformity noted with normal full range of motion of all joints.   Neurologic:  alert & oriented X3 and DTRs symmetrical and normal.   Skin:  Intact without suspicious lesions or rashes Cervical Nodes:  No lymphadenopathy noted Axillary Nodes:  No palpable lymphadenopathy Psych:  memory intact for recent and remote, normally interactive, and good eye contact.     Impression & Recommendations:  Problem # 1:  ROUTINE GENERAL MEDICAL EXAM@HEALTH  CARE FACL (ICD-V70.0)  Orders: EKG w/ Interpretation (93000)  Problem # 2:  SINUSITIS- ACUTE-NOS (ICD-461.9)  Her updated medication list for this problem includes:    Amoxicillin 500 Mg Caps (Amoxicillin) .Marland Kitchen... 1 three times a day  Problem # 3:  C A D (ICD-414.00) as per Dr Tenny Craw Her updated medication list for this problem includes:    Benazepril Hcl 5 Mg Tabs (Benazepril hcl) .Marland Kitchen... 1 by mouth once daily    Furosemide 40 Mg Tabs (Furosemide) .Marland Kitchen... 1 by mouth once daily    Metoprolol Tartrate 100 Mg Tabs (Metoprolol tartrate) .Marland Kitchen... 1/4 tab qd  Problem # 4:  HYPERTENSION, ESSENTIAL NOS (ICD-401.9) controlled Her updated medication list for this problem includes:    Benazepril Hcl 5 Mg Tabs (Benazepril hcl) .Marland KitchenMarland KitchenMarland KitchenMarland Kitchen  1 by mouth once daily    Furosemide 40 Mg Tabs (Furosemide) .Marland Kitchen... 1 by mouth once daily    Metoprolol Tartrate 100 Mg Tabs (Metoprolol tartrate) .Marland Kitchen... 1/4 tab qd  Problem # 5:  HYPERLIPIDEMIA (ICD-272.2)  Her updated medication list for this problem includes:    Crestor 40 Mg Tabs (Rosuvastatin calcium) ..... Once daily  Problem # 6:  HYPOTHYROIDISM (ICD-244.9)  Her updated medication list for this problem includes:    Levothyroxine Sodium 25 Mcg Tabs (Levothyroxine sodium) .Marland Kitchen... 1 by mouth once daily  Complete Medication List: 1)  Lorazepam 1 Mg Tabs (Lorazepam) .Marland Kitchen.. 1 by mouth  bid 2)  Benazepril Hcl 5 Mg Tabs (Benazepril hcl) .Marland Kitchen.. 1 by mouth once daily 3)  Citalopram Hydrobromide 40 Mg Tabs (Citalopram hydrobromide) .Marland Kitchen.. 1 tab daily 4)  Levothyroxine Sodium 25 Mcg Tabs (Levothyroxine sodium) .Marland Kitchen.. 1 by mouth once daily 5)  Furosemide 40 Mg Tabs (Furosemide) .Marland Kitchen.. 1 by mouth once daily 6)  Metoprolol Tartrate 100 Mg Tabs (Metoprolol tartrate) .... 1/4 tab qd 7)  Omeprazole 20 Mg Tbec (Omeprazole) .Marland Kitchen.. 1 by mouth qd 8)  Crestor 40 Mg Tabs (Rosuvastatin calcium) .... Once daily 9)  Amoxicillin 500 Mg Caps (Amoxicillin) .Marland Kitchen.. 1 three times a day  Patient Instructions: 1)  Avoid foods high in acid (tomatoes, citrus juices, spicy foods). Avoid eating within two hours of lying down or before exercising. Do not over eat; try smaller more frequent meals. Elevate head of bed twelve inches when sleeping. 2)  It is important that you exercise regularly at least 20 minutes 5 times a week. If you develop chest pain, have severe difficulty breathing, or feel very tired , stop exercising immediately and seek medical attention. Begin a graduated program over 6-8 weeks. 3)  Schedule your mammogram. 4)  Schedule a colonoscopy  to help detect colon cancer if due as per Dr Jeralene Peters. 5)  Take an  81 mg coated Aspirin every day. 6)  Drink as much NON dairy fluid as you can tolerate for the next few days. Neti pot once daily as needed. Prescriptions: AMOXICILLIN 500 MG CAPS (AMOXICILLIN) 1 three times a day  #30 x 0   Entered and Authorized by:   Marga Melnick MD   Signed by:   Marga Melnick MD on 06/02/2010   Method used:   Electronically to        [facility]* (retail)       [address] [place], [place]  [address]       Ph: [phone]       Fax: [phone]   RxID:   [id number]    Orders Added: 1)  Est. Patient 40-64 years [99396] 2)  EKG w/ Interpretation [93000]

## 2010-06-21 ENCOUNTER — Telehealth: Payer: Self-pay | Admitting: Internal Medicine

## 2010-06-27 ENCOUNTER — Telehealth: Payer: Self-pay | Admitting: Internal Medicine

## 2010-06-27 ENCOUNTER — Emergency Department (HOSPITAL_COMMUNITY)
Admission: EM | Admit: 2010-06-27 | Discharge: 2010-06-28 | Disposition: A | Discharge status: Home / Self Care | Payer: Commercial Managed Care - PPO | Attending: Emergency Medicine | Admitting: Emergency Medicine

## 2010-06-27 ENCOUNTER — Encounter: Payer: Self-pay | Admitting: Nurse Practitioner

## 2010-06-27 ENCOUNTER — Telehealth (INDEPENDENT_AMBULATORY_CARE_PROVIDER_SITE_OTHER): Payer: Self-pay | Admitting: *Deleted

## 2010-06-27 DIAGNOSIS — R0789 Other chest pain: Secondary | ICD-10-CM | POA: Insufficient documentation

## 2010-06-27 DIAGNOSIS — K219 Gastro-esophageal reflux disease without esophagitis: Secondary | ICD-10-CM | POA: Insufficient documentation

## 2010-06-27 DIAGNOSIS — I10 Essential (primary) hypertension: Secondary | ICD-10-CM | POA: Insufficient documentation

## 2010-06-27 DIAGNOSIS — Z79899 Other long term (current) drug therapy: Secondary | ICD-10-CM | POA: Insufficient documentation

## 2010-06-27 DIAGNOSIS — E789 Disorder of lipoprotein metabolism, unspecified: Secondary | ICD-10-CM | POA: Insufficient documentation

## 2010-06-27 DIAGNOSIS — R002 Palpitations: Secondary | ICD-10-CM | POA: Insufficient documentation

## 2010-06-27 DIAGNOSIS — F341 Dysthymic disorder: Secondary | ICD-10-CM | POA: Insufficient documentation

## 2010-06-27 LAB — BASIC METABOLIC PANEL
BUN: 11 mg/dL (ref 6–23)
Chloride: 99 mEq/L (ref 96–112)
Creatinine, Ser: 0.88 mg/dL (ref 0.4–1.2)
GFR calc Af Amer: >60 mL/min (ref >60)
GFR calc non Af Amer: >60 mL/min (ref >60)
Glucose, Bld: 105 mg/dL — ABNORMAL HIGH (ref 70–99)
Potassium: 4.3 mEq/L (ref 3.5–5.1)
Sodium: 134 mEq/L — ABNORMAL LOW (ref 135–145)

## 2010-06-27 LAB — DIFFERENTIAL
Basophils Absolute: 0 10*3/uL (ref 0.0–0.1)
Basophils Relative: 0 % (ref 0–1)
Eosinophils Absolute: 0.3 10*3/uL (ref 0.0–0.7)
Eosinophils Relative: 5 % (ref 0–5)
Lymphocytes Relative: 30 % (ref 12–46)
Monocytes Absolute: 0.5 10*3/uL (ref 0.1–1.0)
Monocytes Relative: 8 % (ref 3–12)
Neutro Abs: 3.6 10*3/uL (ref 1.7–7.7)

## 2010-06-27 LAB — POCT CARDIAC MARKERS
CKMB, poc: <1 ng/mL — ABNORMAL LOW (ref 1.0–8.0)
Myoglobin, poc: 82.3 ng/mL (ref 12–200)

## 2010-06-27 LAB — CBC
HCT: 43.1 % (ref 36.0–46.0)
MCH: 29.1 pg (ref 26.0–34.0)
MCV: 85.3 fL (ref 78.0–100.0)
Platelets: 173 10*3/uL (ref 150–400)
RBC: 5.05 MIL/uL (ref 3.87–5.11)
RDW: 13.3 % (ref 11.5–15.5)
WBC: 6.3 10*3/uL (ref 4.0–10.5)

## 2010-06-27 NOTE — Progress Notes (Signed)
Summary: DME order for CPAP supplies  Phone Note Call from Patient   Summary of Call: Patient called noting that she needs new mask for her machine. She has spoken with [facility], and was told that Hop will have to send order for "cpap medical supplies" to the at fax [phone]. Ok to enter DME?  Lucious Groves CMA  June 21, 2010 9:07 AM   Follow-up for Phone Call        who nordered her CPAP equipment; usually it is ordered by a Sleep Specialist who performed  & read studies Follow-up by: Marga Melnick MD,  June 21, 2010 11:28 AM  Additional Follow-up for Phone Call Additional follow up Details #1::        Patient notes that she had sleep apnea test years ago and it was previously given by Alwyn Ren and brought out to her by [facility]. Additional Follow-up by: Lucious Groves CMA,  June 21, 2010 11:41 AM    Additional Follow-up for Phone Call Additional follow up Details #2::    OK ;renew by order Follow-up by: Marga Melnick MD,  June 21, 2010 3:02 PM  Additional Follow-up for Phone Call Additional follow up Details #3:: Details for Additional Follow-up Action Taken: Done, thanks. Additional Follow-up by: Lucious Groves CMA,  June 21, 2010 3:11 PM

## 2010-06-28 ENCOUNTER — Encounter (INDEPENDENT_AMBULATORY_CARE_PROVIDER_SITE_OTHER): Payer: Commercial Managed Care - PPO

## 2010-06-28 ENCOUNTER — Telehealth: Payer: Self-pay | Admitting: Internal Medicine

## 2010-06-28 ENCOUNTER — Emergency Department (HOSPITAL_COMMUNITY): Payer: Commercial Managed Care - PPO

## 2010-06-28 DIAGNOSIS — R002 Palpitations: Secondary | ICD-10-CM

## 2010-06-28 LAB — POCT CARDIAC MARKERS
CKMB, poc: <1 ng/mL — ABNORMAL LOW (ref 1.0–8.0)
Myoglobin, poc: 88 ng/mL (ref 12–200)

## 2010-06-28 LAB — TROPONIN I: Troponin I: 0.01 ng/mL (ref 0.00–0.06)

## 2010-06-28 LAB — CK TOTAL AND CKMB (NOT AT ARMC)
CK, MB: 1 ng/mL (ref 0.3–4.0)
Relative Index: INVALID (ref 0.0–2.5)
Total CK: 83 U/L (ref 7–177)

## 2010-07-03 ENCOUNTER — Encounter: Payer: Self-pay | Admitting: Nurse Practitioner

## 2010-07-03 ENCOUNTER — Ambulatory Visit (INDEPENDENT_AMBULATORY_CARE_PROVIDER_SITE_OTHER): Payer: Commercial Managed Care - PPO | Admitting: Nurse Practitioner

## 2010-07-03 ENCOUNTER — Ambulatory Visit: Payer: Commercial Managed Care - PPO | Admitting: Physician Assistant

## 2010-07-03 DIAGNOSIS — R002 Palpitations: Secondary | ICD-10-CM | POA: Insufficient documentation

## 2010-07-06 NOTE — Progress Notes (Signed)
Summary: sob and afib   Phone Note Call from Patient Call back at Home Phone [phone] Call back at ext [phone]   Caller: Patient Summary of Call: pt states she in afib and sob. pt been dizzy as well. Initial call taken by: Roe Coombs,  June 27, 2010 4:37 PM  Follow-up for Phone Call        Pt. c/o of being in a-fib. She feels her heart beating fast. also she c/o of dizziness. She denies SOB. Pt. states she has a head cold and the dizziness could come from that. B/P 113/69. Pt. said that an EKG was done at Dr. Frederik Pear office where it shows that she has a prolonged QT. The ekg was faxed to Dr. Tenny Craw. Pt.  does not want to go to the ER. I let pt. know that it is too late for her to be seen in the office.  Adviced pt. that if symptoms do not improve or get worse, she needs to call the on call person or go to Er. Pt. verbalized understanding Follow-up by: Ollen Gross, RN, BSN,  June 27, 2010 4:56 PM

## 2010-07-06 NOTE — Progress Notes (Signed)
Summary: Cardiology Phone Note - Heart Fluttering/Dizziness   Phone Note Call from Patient   Caller: Patient Summary of Call: Pt called to c/o palpitations, sob, and lightheadedness. She feels particularly dizzy when she stands up. She notes that at last office visit with PCP, she was told there was something wrong with her QT - reviewed EKG from Dr. Alwyn Ren and QTC was 438 so I'm not sure what she's referring to, but she does have significant coronary hx. Unfortunately too late to be seen in office, so advised pt to proceed to ER to err on safe side especially in the setting of symptoms of dizziness. She expressed understanding. Initial call taken by: Ronie Spies PA-C

## 2010-07-06 NOTE — Progress Notes (Signed)
Summary: pt was seen in ED last night    Phone Note Call from Patient Call back at [phone]   Caller: Patient Reason for Call: Talk to Nurse, Talk to Doctor Summary of Call: pt was in ED last night and was told to get a holter monitor today she spoke to Windell Moulding and was told we didn't have one in and pt is very concerned because she was told to get it today and she wants to talk to Dr. Tenny Craw and I told her Dr. Tenny Craw was not in today Initial call taken by: Omer Jack,  June 28, 2010 10:08 AM  Follow-up for Phone Call        Spoke with pt. She will come in today for 48 hour holter monitor and follow up with Lilian Coma on July 03, 2010 at 11:30. Follow-up by: Dossie Arbour, RN, BSN,  June 28, 2010 10:48 AM     Appended Document: Orders Update    Clinical Lists Changes  Orders: Added new Referral order of Holter (Holter) - Signed

## 2010-07-11 NOTE — Assessment & Plan Note (Signed)
Summary: Cardiology Office Note - Palpitations      Allergies Added:   Visit Type:  Follow-up Primary Provider:  Marga Melnick MD  CC:  shortness of breath - some.  History of Present Illness: 60 year old female with prior history of hypertension nonobstructive coronary artery disease who was in her usual state of health until February 28 when while at work she noted a fluttering, palpitations, and dizziness.  She had a coworker check her blood pressure and heart rate at work and her heart rate was 73 with a blood pressure in the 160s.  She reports that she was quite anxious.  She later called our on-call staff and was advised to present to the ED which she did.  There, she was found to be in sinus rhythm with a rate in the 80s.  Lab work was unrevealing the patient was subsequently discharged from the emergency department.  She later called the office and was set up with a 48-hour Holter monitor.  Review monitoring today shows no events.  Patient also reports that she did not have any symptoms either while wearing the monitor worsened she had it again last Friday.  She has not had any chest pain or dyspnea.  She has no complaints today.  Current Medications (verified): 1)  Lorazepam 1 Mg  Tabs (Lorazepam) .Marland Kitchen.. 1 By Mouth Bid 2)  Benazepril Hcl 5 Mg  Tabs (Benazepril Hcl) .Marland Kitchen.. 1 By Mouth Once Daily 3)  Citalopram Hydrobromide 40 Mg  Tabs (Citalopram Hydrobromide) .Marland Kitchen.. 1 Tab Daily 4)  Levothyroxine Sodium 25 Mcg  Tabs (Levothyroxine Sodium) .Marland Kitchen.. 1 By Mouth Once Daily 5)  Furosemide 40 Mg  Tabs (Furosemide) .Marland Kitchen.. 1 By Mouth Once Daily 6)  Metoprolol Tartrate 100 Mg  Tabs (Metoprolol Tartrate) .... 1/4 Tab Qd 7)  Omeprazole 20 Mg  Tbec (Omeprazole) .Marland Kitchen.. 1 By Mouth Qd 8)  Crestor 40 Mg Tabs (Rosuvastatin Calcium) .... Once Daily  Allergies (verified): 1)  ! Sulfa 2)  ! Septra 3)  ! * Raniclor  Review of Systems       as per history of present illness.  Otherwise all systems reviewed and  negative.  Vital Signs:  Patient profile:   60 year old female Height:      64 inches Weight:      209 pounds BMI:     36.00 Pulse rate:   72 / minute BP sitting:   106 / 60  (left arm) Cuff size:   large  Vitals Entered By: Hardin Negus, RMA (July 03, 2010 11:39 AM)  Physical Exam  General:  Well developed, well nourished, in no acute distress. Head:  HEENT: Normal Neck:  supple without bruits or JVD. Lungs:  respirations regular and unlabored, clear to auscultation. Heart:  regular S1, S2, no S3, S4, or murmurs. Abdomen:  round, soft, nontender, nondistended, bowel sounds present x4. Msk:  moves all extremities. Pulses:  dorsalis pedis and posterior tibial pulses are 2+ and equal bilaterally. Extremities:  no clubbing, cyanosis, or edema. Neurologic:  awake alert and oriented x3. Skin:  warm and dry. Psych:  normal affect.   Impression & Recommendations:  Problem # 1:  PALPITATIONS (ICD-785.1) pt. had palpitations lasting for good portion of the day on 2/28 though was found to be in sinus rhythm in the er w/ a hr of 85, during symptoms.  labs in the ER were WNL and she had a NL TSH on 1/30.  she then had f/u 48h holter that was  unrevealing - no symptoms and no findings (reports of svt appear to be artifact).  She has had no recurrence of Ss and wishes to avoid additional monitoring which is reasonable.  She'll continue on her current dose of metoprolol.  she's been advised to avoid caffeine.  finally, if she has any recurrence, we will arrange for an event recorder and consider repeat echo (normal in 2007).  Problem # 2:  C A D (ICD-414.00) h/o nonobs. dzs.  no c/p or dyspnea.  cont. Bb, statin.  Patient Instructions: 1)  Your physician wants you to follow-up in:   6 MONTHS WITH DR. Tenny Craw. You will receive a reminder letter in the mail two months in advance. If you don't receive a letter, please call our office to schedule the follow-up appointment.

## 2010-08-07 ENCOUNTER — Other Ambulatory Visit: Payer: Self-pay | Admitting: *Deleted

## 2010-08-07 MED ORDER — LEVOTHYROXINE SODIUM 25 MCG PO TABS: 25.0000 ug | ORAL_TABLET | Freq: Every day | ORAL | Status: DC

## 2010-08-08 ENCOUNTER — Other Ambulatory Visit: Payer: Self-pay

## 2010-08-08 NOTE — Telephone Encounter (Signed)
Duplicate request for Thyroid med, was sent in 08/07/10

## 2010-08-28 ENCOUNTER — Other Ambulatory Visit: Payer: Self-pay

## 2010-08-28 MED ORDER — BENAZEPRIL HCL 5 MG PO TABS: 5.0000 mg | ORAL_TABLET | Freq: Every day | ORAL | Status: DC

## 2010-08-28 MED ORDER — FUROSEMIDE 40 MG PO TABS: 40.0000 mg | ORAL_TABLET | Freq: Every day | ORAL | Status: DC

## 2010-08-31 ENCOUNTER — Encounter: Payer: Self-pay | Admitting: Family Medicine

## 2010-08-31 ENCOUNTER — Ambulatory Visit (INDEPENDENT_AMBULATORY_CARE_PROVIDER_SITE_OTHER): Payer: Commercial Managed Care - PPO | Admitting: Family Medicine

## 2010-08-31 VITALS — BP 100/60 | Temp 98.8°F | Wt 213.2 lb

## 2010-08-31 DIAGNOSIS — N63 Unspecified lump in unspecified breast: Secondary | ICD-10-CM

## 2010-08-31 DIAGNOSIS — N6320 Unspecified lump in the left breast, unspecified quadrant: Secondary | ICD-10-CM | POA: Insufficient documentation

## 2010-08-31 MED ORDER — IBUPROFEN 600 MG PO TABS: 600.0000 mg | ORAL_TABLET | Freq: Three times a day (TID) | ORAL | Status: AC | PRN

## 2010-08-31 NOTE — Progress Notes (Signed)
  Subjective:    Patient ID: Kathleen Sellers, female    DOB: 05/27/1950, 60 y.o.   MRN: [id number]  HPI L breast pain- feels area is swollen and LNs in armpit and neck are swollen and sore.  L ear was hurting last week but this has gone away.  + erythema.  Uncertain if area is warm.  No drainage.  No fevers.  MGM had breast CA late in life.  Not UTD on mammogram.     Review of Systems For ROS see HPI     Objective:   Physical Exam  Pulmonary/Chest:            Assessment & Plan:

## 2010-08-31 NOTE — Assessment & Plan Note (Signed)
Pt w/ L breast pain, ~1 cm firm, mobile mass at 2 o'clock position w/ firm, tender LN in L axilla.  Will need diagnostic mammogram.  Start ibuprofen prn for pain.  Will follow closely.

## 2010-08-31 NOTE — Patient Instructions (Signed)
Someone will call you with your mammogram appt Take the Ibuprofen for pain and swelling Wear a good supportive bra or sports bra to keep breasts from pulling Call with any questions or concerns Hang in there!

## 2010-09-12 NOTE — Assessment & Plan Note (Signed)
[facility]                            CARDIOLOGY OFFICE NOTE   Kathleen Sellers                     MRN:          [id number]  DATE:10/03/2007                            DOB:          02/27/1951    IDENTIFICATION:  Kathleen Sellers is a 60 year old woman with a history of  coronary artery disease, dyslipidemia, hypothyroidism, hypertension.  I  last saw her in November.   In the interval she has done okay.  Note:  After seeing her blood work,  it had elevation of AST and ALT.  I stopped the simvastatin, it  continued to remain high.  I checked a hepatitis panel and sent her to  GI.  GI has maintained her off this and her liver enzymes normalized.  She is now on omeprazole therapy and continues on this.   She denies chest pain, walks 15 minutes per day.  Denies significant  shortness of breath.   MEDICATIONS:  1. Lorazepam 1 b.i.d.  2. Benazepril 5.  3. Citalopram 60 daily.  4. Synthroid 25 mcg  5. Lasix 40.  6. Omeprazole 20.  7. Metoprolol 25.   PHYSICAL EXAM:  GENERAL:  The patient is in no distress.  VITAL SIGNS:  Blood pressure is 97/66 on arrival, but on my check  120/80,  pulse is 70 and regular,  weight 228.  LUNGS:  Clear.  No rales.  CARDIAC EXAM:  Regular rate and rhythm, S1, S2.  No S3.  No murmurs.  ABDOMEN:  Benign.  EXTREMITIES:  No edema.  Good distal pulses.   IMPRESSION:  12-lead EKG:  Normal sinus rhythm, 68 beats per minute.  Nonspecific T-wave changes.  T-wave inversions in V1-V4, unchanged from  previous.   IMPRESSION:  1. Coronary artery disease:  Last cardiac catheterization done in 2003      showed an LVEF of 60%, LAD had a 50 to 60% ostial narrowing,  20%      mid to distal lesion.  Note:  The left main had a 30 to 40%      stenosis.  Left circumflex had a small OM1. OM2 was normal in size,      60% stenosis at its origin.  RCA with 20% stenosis proximally.  She      seems to be doing okay.  I encouraged her to  continue to increase      her activity and continue on medicines.  2. Hypertension:  Adequate control.  3. Dyslipidemia:  Does need something.  I would like to check a      fasting lipomat as well as AST and ALT before initiating, consider      Pravachol.   I will set followup for the winter, but again I will be in touch with  the patient regarding her blood work.     Pricilla Riffle, MD, [facility]  Electronically Signed    PVR/MedQ  DD: 10/03/2007  DT: 10/03/2007  Job #: [id number]   cc:   Dr. Alwyn Ren

## 2010-09-12 NOTE — Assessment & Plan Note (Signed)
[facility]                            CARDIOLOGY OFFICE NOTE   Kathleen Sellers, Kathleen Sellers                     MRN:          [id number]  DATE:04/29/2008                            DOB:          12-03-1950    IDENTIFICATION:  Kathleen Sellers is a 60 year old woman.  I last saw her in  June.  She has a history of CAD, dyslipidemia, hypothyroidism, and  hypertension.   Since seen, she complains of some pains in her feet.  Not with walking.  She denies chest pain.  Her breathing is okay.   CURRENT MEDICATIONS:  1. Lorazepam 1 b.i.d.  2. Benazepril 5 daily.  3. Citalopram 60 daily.  4. Synthroid 25 mcg daily.  5. Lasix 40 daily.  6. Omeprazole.  7. Metoprolol 25 daily.  8. Aspirin 81.  9. Crestor 40.   PHYSICAL EXAMINATION:  GENERAL:  On exam, the patient is in no distress.  VITAL SIGNS:  Blood pressure is 109/77, pulse is 65, and weight is 229,  relatively stable.  LUNGS:  Clear.  No rales.  CARDIAC:  Regular rate and rhythm.  S1 and S2.  No S3.  No murmurs.  EXTREMITIES:  No edema and good pulses.   A 12-lead EKG, normal sinus rhythm, 65 beats per minute.  T-wave  inversion in V1 through V4.  Note, this is present on previous EKGs.   IMPRESSION:  1. Coronary artery disease.  Last cath in 2003 (left main 30-40%      stenosis; left anterior descending with a 50-60% ostial narrowing,      20% mid lesion; left circumflex was 60%, Obtuse marginal 2 stenosis      at the origin; right coronary artery with 20% stenosis).      Clinically, appears to be stable.  We would continue.  2. Hypertension, good control.  3. Dyslipidemia.  Currently, on Crestor.  Last lipid panel done on      March 23, 2008, LDL was 73 with 1206 particles, HDL was 37,      triglycerides are 108, total cholesterol 132.  I would not add more      to her regimen.  Encouraged her to continue to watch her weight and      try to get it down.  Watch her diet.  4. Foot pain and cramping.   Good pulses.  We will check electrolytes      and magnesium.     Pricilla Riffle, MD, [facility]  Electronically Signed    PVR/MedQ  DD: 05/02/2008  DT: 05/02/2008  Job #: [id number]

## 2010-09-12 NOTE — Assessment & Plan Note (Signed)
[facility]ALTHCARE                            CARDIOLOGY OFFICE NOTE   Kathleen Sellers, Kathleen Sellers                     MRN:          [id number]  DATE:09/27/2006                            DOB:          03/23/1951    Kathleen Sellers is a 60 year old white female who has nonobstructive coronary  artery disease by heart catheterization July 30, 2001.  Specifically,  she had a 30% left main, 50% LAD, 60% OM-2 and perhaps some catheter  induced spasm with a 20% lesion in the right coronary artery.  She had  normal left ventricular systolic function.   She was seen last spring for chest discomfort.  A stress Myoview August 09, 2005, demonstrated an EF of 78% with breast artifact.  Dr. Tenny Sellers  suggested continual medical therapy and risk factor modification.   Today, she complains of chest hurting along the left sternal border that  sometimes will get bad enough it will go into the left arm.  It is  related to twisting her upper chest or torso.  It is not related to  walking.  She also complains of not being able to catch her breath when  she bends over at the waist.  She is having a lot of leg pain,  particularly the left side.   Her risk factors are known disease, obesity, sedentary lifestyle,  hyperlipidemia and hypertension.   She denies any orthopnea, PND or significant edema.  She really does not  have any exertional symptoms.  She has had no presyncope or syncope.  She has some occasional reflux but this is clearly not reflux per her  history.   MEDICATIONS:  1. Lorazepam 1 mg p.o. b.i.d.  2. Benazepril 5 mg a day.  3. Citalopram 60 mg a day.  4. Crestor 10 mg a day.  5. Levothyroxine 25 mcg a day.  6. Metoprolol 100 mg a day.  7. Furosemide 40 mg a day.  8. Nexium 40 mg a day.   PHYSICAL EXAMINATION:  GENERAL APPEARANCE:  She is in no acute distress.  She is very pleasant.  VITAL SIGNS:  Her blood pressure today is 120/78, her pulse is 71 and  regular,  weight is 231 up 14 pounds since last year.  SKIN:  Warm and dry. Intact.  HEENT:  Normocephalic and atraumatic.  PERRLA.  Extraocular movements  intact.  Sclerae are clear.  Facial symmetry is normal.  NECK:  Supple.  Carotid upstrokes are equal bilaterally without bruits.  No JVD.  Thyroid is not enlarged.  Trachea is midline.  LUNGS:  Clear.  CARDIOVASCULAR:  Poorly appreciated PMI.  Normal S1 and S2 without  murmurs, rubs, or gallops.  ABDOMEN:  Protuberant with good bowel sounds.  There was no obvious  tenderness.  Her size precludes accurate assessment of organomegaly.  EXTREMITIES:  Trace edema.  Pulses are present bilaterally and  symmetrical.  There is no sign of DVT. There are no venous varicosities.  NEUROLOGIC:  Intact.   EKG is normal and unchanged.   I have had about a 20+ minute discussion with Kathleen Sellers and her  husband.  I do not think this is cardiovascular discomfort.  I think it  is most likely musculoskeletal which seems to be becoming a chronic  issue.  A lot of this is due to her body habitus.  Her bending over and  getting short of breath is also due to her excess weight.  She is also  putting a lot of strain on her hips and her lower torso.   I have recommended a sincere commitment to therapeutic lifestyle  changes.  I have made no changes to her medical program.   At the end of our discussion, she said that Dr. Tenny Sellers said that she was  due for a heart catheterization.  She is not on a research protocol.  I  told her at the present time, I did not see any indication for that.  I  will schedule her to see Dr. Tenny Sellers to discuss it further.     Thomas C. Daleen Squibb, MD, [facility]  Electronically Signed    TCW/MedQ  DD: 09/27/2006  DT: 09/27/2006  Job #: [id number]   cc:   Titus Dubin. Alwyn Ren, MD,FACP,FCCP

## 2010-09-12 NOTE — Assessment & Plan Note (Signed)
 HEALTHCARE                            CARDIOLOGY OFFICE NOTE   ABERDEEN, HAFEN                     MRN:          [id number]  DATE:03/14/2007                            DOB:          [birth year]    IDENTIFICATION:  Ms. Kathleen Sellers is a 60 year old woman with a history of  mild CAD on cath in 2003, dyslipidemia, hypothyroidism, hypertension,  last seen in cardiology clinic actually by [facility] C. Wall, MD, in May.   Since seen, she has done well.  She denies chest pain, breathing is  okay.  She is planning to move with her daughter to a neighborhood where  she is able to walk and she said she should be getting more exercise,  looking forward to losing weight.   She requests today if she could be switched from Lovastatin from her  Zocor for price concerns.   CURRENT MEDICATIONS:  1. Lorazepam 1 mg b.i.d.  2. Benzopril 5.  3. Citalopram 60.  4. Simvastatin 80.  5. Omeprazole 20.  6. Furosemide 40.  7. Metoprolol 100.  8. Synthroid 25 mcg.   PHYSICAL EXAMINATION:  VITAL SIGNS:  Blood pressure 112/80, pulse 75,  weight 226.  GENERAL APPEARANCE:  Patient is in no distress.  CHEST:  Nontender.  LUNGS:  Clear.  CARDIOVASCULAR:  Regular rate and rhythm, S1 and S2, no S3.  ABDOMEN:  Benign.  EXTREMITIES:  No edema.   A 12-lead EKG shows sinus rhythm, 72 beats per minute, nonspecific T-  wave changes with T-wave inversion V1, V2 and V3, unchanged.   LABORATORY DATA:  Labs from Dr. Frederik Pear office show LDL of 82, HDL 33,  triglycerides 137, total cholesterol 142.   IMPRESSION:  1. Coronary artery disease, mild, asymptomatic.  I would continue on      current regimen.  2. Dyslipidemia.  I would not switch her for now.  I would like to      check some repeat liver function tests as her AST and ALT were a      little elevated.  I am reluctant to add anything else for right now      until we clarify this and also with exercise, her HDL may  improve.      I would recommend follow-up later in the spring, but I will be in      touch with her once I have seen the liver results.   I will set follow-up for late spring, early summer, sooner if problems  develop.  I will be in touch with her with the blood results today.   ADDENDUM:  Encouraged her to walk as planned.     Pricilla Riffle, MD, [facility]  Electronically Signed    PVR/MedQ  DD: 03/14/2007  DT: 03/16/2007  Job #: [id number]   cc:   Titus Dubin. Alwyn Ren, MD,FACP,FCCP

## 2010-09-15 NOTE — Op Note (Signed)
NAMEJAIAH, Kathleen Sellers              ACCOUNT NO.:  [id number]   MEDICAL RECORD NO.:  [id number]          PATIENT TYPE:  OBV   LOCATION:  0098                         FACILITY:  [facility]   PHYSICIAN:  Lorre Munroe., M.D.DATE OF BIRTH:  [birth year]   DATE OF PROCEDURE:  02/29/2004  DATE OF DISCHARGE:                                 OPERATIVE REPORT   PREOPERATIVE DIAGNOSIS:  Symptomatic gallstones.   POSTOPERATIVE DIAGNOSIS:  Symptomatic gallstones.   OPERATION/PROCEDURE:  Laparoscopic cholecystectomy with operative  cholangiogram.   SURGEON:  Dr. Orson Slick.   ASSISTANT:  Dr. Adriana Mccallum.   ANESTHESIA:  General.   PROCEDURE:  After the patient was monitored and anesthetized and had routine  preparation and draping of the abdomen, I infiltrated local anesthetic below  the umbilicus and made a short midline incision, dissected down to the  fascia, opened it in the midline, opened the peritoneum bluntly and swept  away adhesions which were present.  I placed a 0 Vicryl pursestring suture  in the fascia, then secured a Hasson cannula and inflated the abdomen with  carbon dioxide.  Vision of the lower abdomen was obscured by adhesions.  The  upper abdomen had no abnormalities except for adherence of the omentum to  the undersurface of the gallbladder and liver.  I placed three more ports  under direct vision through sites which were infiltrated with local  anesthetic then placed the patient head up, foot down and tilted toward the  left.  I took down the adhesions of omentum to the liver with scissors and  cautery, then grasped the fundus of the gallbladder and elevated it toward  the right shoulder and dissected down the adhesions further until I could  identify the infundibulum of the gallbladder.  I pulled the infundibulum  laterally and dissected the hepatoduodenal ligament until I could see the  cystic duct emerging from the infundibulum.  I also saw the common bile duct  and saw  that it looped up toward the gallbladder but I could clearly see the  cystic duct separate from it and felt sure I was clipping the cystic duct  rather than the right hepatic duct.  I clipped the cystic duct with one  clip, then made a small hole just distally and put in a cholangiogram  catheter.  After securing that with the clip, I performed a fluoroscopic  cholangiogram which showed normal size ducts with normal anatomy  demonstrating both left and right hepatic radicles and free flow of the  contrast material into the duodenum without any filling defects.  I then  repositioned the patient and removed the clip and the catheter and clipped  the distal cystic duct with three clips and then divided it between the two  closest to the gallbladder.  I then dissected out the cystic artery and  similarly clipped and divided it and then dissected the gallbladder from the  liver using the spatula and the hook dissectors with cautery.  The plane of  dissection was somewhat difficult because of the thickness of the  gallbladder wall and a great deal of scar tissue  being present and the fact  that the gallbladder was mostly within the substance of the liver.  However,  I got it free and gained good hemostasis with the cautery.  There was one  small hole made in the gallbladder and I cut above the thin places so I  placed it in a plastic pouch and reserved it for removal.  I then copiously  irrigated the operative area and checked and saw that the clips were secure  and removed irrigant and bile and blood.  I then removed the gallbladder  through the umbilical incision in the plastic pouch and  tied the pursestring suture.  I removed the two lateral ports under direct  vision, then allowed the CO2 to escape and remove the epigastric port.  I  closed all of the skin incisions with intracuticular 4-0 Vicryl and Steri-  Strips.  She tolerated the operation well.      WB/MEDQ  D:  02/29/2004  T:   02/29/2004  Job:  [id number]   cc:   Titus Dubin. Alwyn Ren, M.D. [facility]

## 2010-09-15 NOTE — H&P (Signed)
NAMELETTI, TOWELL              ACCOUNT NO.:  [id number]   MEDICAL RECORD NO.:  [id number]          PATIENT TYPE:  INP   LOCATION:  0456                         [facility]ACILITY[facility]   PHYSICIAN:  Ollen Gross. Vernell Morgans, M.D. DATE OF BIRTH:  16-Aug-1950   DATE OF ADMISSION:  03/01/2004  DATE OF DISCHARGE:                                HISTORY & PHYSICAL   HISTORY OF PRESENT ILLNESS:  Ms. Grode is a 60 year old white female who  is today postoperative day #1 from a laparoscopic cholecystectomy for  gallstones.  She was doing reasonably well this morning, was discharged.  She returns this evening with fevers and shaking chills.  Her temperature  has been up to 101.9.  She also notes some chest pain and shortness of  breath.  She has not had any nausea or vomiting.  The fever and shortness of  breath has gotten worse throughout the day.   PAST MEDICAL HISTORY:  1.  Coronary artery disease.  2.  Sleep apnea.   PAST SURGICAL HISTORY:  Laparoscopic cholecystectomy.   MEDICATION:  1.  Benazepril.  2.  Ativan.   ALLERGIES:  SULFA.   SOCIAL HISTORY:  She denies any current use of alcohol or tobacco products.   FAMILY HISTORY:  Noncontributory.   PHYSICAL EXAMINATION:  GENERAL:  She is a well-developed, well-nourished,  white female in no acute distress, but she does feel warm.  SKIN:  Warm and dry with no jaundice.  HEENT:  Eyes - extraocular muscles are intact.  Pupils equal, round and  reactive to light.  Sclerae are nonicteric.  LUNGS:  Clear bilaterally with no use of accessory respiratory muscles,  although she does look just a little bit short of breath.  HEART:  Regular rate and rhythm with an impulse in the left chest.  ABDOMEN:  Soft with some mild right upper quadrant tenderness, but no  evidence of peritonitis.  EXTREMITIES:  No clubbing, cyanosis, or edema.  PSYCHIATRIC:  Alert and oriented x3 with no evidence of anxiety or  depression.   LABORATORY DATA:  Pending.   ASSESSMENT AND PLAN:  This is a 60 year old white female on postoperative  day #1 from a laparoscopic cholecystectomy with chest pain, shortness of  breath, and fever.  Differential diagnoses can include bile leak,  pneumonia, myocardial infarction, as well as others.  We will plan to admit  her tonight for close monitoring, and start her on broad spectrum  antibiotics.  We will check her white count, liver functions, chest x-ray,  EKG, cardiac enzymes, and monitor her closely.      PST/MEDQ  D:  03/01/2004  T:  03/02/2004  Job:  [id number]

## 2010-09-15 NOTE — Cardiovascular Report (Signed)
. [facility]  Patient:    AYIANNA, DARNOLD Visit Number: [id number] MRN: [id number]          Service Type: CAT Location: [facility] 2858 [id number] Attending Physician:  Daisey Must Dictated by:   Daisey Must, M.D. [facility] Proc. Date: 07/30/01 Admit Date:  07/30/2001 Discharge Date: 07/30/2001   CC:         Titus Dubin. Alwyn Ren, M.D. [facility]  Dietrich Pates, M.D. [facility]   Cardiac Catheterization  PROCEDURE PERFORMED:  Right and left heart catheterization with coronary angiography and left ventriculography.  INDICATION:  Ms. Peckman is a 60 year old woman with cardiac risk factors. She has presented with progressive exertional dyspnea.  She was evaluated by Dr. Tenny Craw and referred for right and left heart catheterization because of the above symptoms.  PROCEDURAL NOTE:  An 8-French sheath was placed in the right femoral vein, a 6-French sheath in the right femoral artery.  Right heart catheterization was performed with a Swan-Ganz catheter.  Left heart catheterization was performed with a 6-French JL-4 to image the left coronary arteries.  A 6-French ______ catheter to image the right coronary arteries, and an angled pigtail catheter for the left ventriculogram.  Contrast was Omnipaque.  There were no complications.  HEMODYNAMIC DATA: 1.  Right atrial mean pressure 9, right ventricular pressure 28/10, pulmonary     artery pressure 27/14, pulmonary capillary wedge mean pressure 13, left     ventricular pressure 120/14, aortic pressure 126/80.  There was no aortic     valve gradient. 2.  Cardiac output by the femoral dilution method is 5.0, cardiac index 2.6,     cardiac output by the FICK method is 4.0, cardiac index 2.0. 3.  Left ventriculogram wall motion has normal ejection fraction calculated at     69%. 4.  Coronary arteriography (right dominant): Left main has a proximal 30-40%     stenosis which is fairly eccentric.  This is seen mainly in the LAO and  cranial views. 5.  Left anterior descending artery has a 50-60% stenosis at its ostium.  The     LAD gives rise to a single large diagonal branch.  The mid to distal LAD     has a diffuse 20% stenosis. 6.  The left circumflex gives rise to a small OM-1, normal sized branching     OM-2, and a normal-sized branching OM-3.  The OM-2 has a 60% stenosis at     its origin. 7.  The right coronary artery has a 20% stenosis proximally.  There was some     catheter-induced spasm which was relieved with nitroglycerin.  The distal     right coronary artery gives rise to a normal-sized posterior descending     artery and a small posterolateral branch.  IMPRESSIONS: 1. Normal right and left heart filling pressures with normal pulmonary artery    pressures. 2. Normal left ventricular systolic function. 3. Moderate coronary artery disease as described with lesions of borderline    severity in the ostium, the left anterior descending artery, and the ostium    of the second marginal branch.  PLAN: 1. A stress Cardiolite will be obtained to rule out ischemia.  If there is    ischemia in the anterior wall, the patient would require a bypass surgery. 2. If there is ischemia in the lateral wall, the circumflex could be treated    percutaneously. Dictated by:   Daisey Must, M.D. [facility] Attending Physician:  Daisey Must DD:  07/30/01  TD:  07/31/01 Job: [id number]

## 2010-09-15 NOTE — Procedures (Signed)
NAME:  Kathleen Sellers, Kathleen Sellers            ACCOUNT NO.:  [id number]   MEDICAL RECORD NO.:  [id number]          PATIENT TYPE:  OUT   LOCATION:  SLEEP CENTER                 FACILITY:  [facility]   PHYSICIAN:  Clinton D. Maple Hudson, M.D. DATE OF BIRTH:  19-May-1950   DATE OF ADMISSION:  11/24/2003  DATE OF DISCHARGE:  11/24/2003                              NOCTURNAL POLYSOMNOGRAM   REFERRING PHYSICIAN:  Melvyn Novas, M.D.   HISTORY:  The patient's height is 5 foot 4 inches.  Her age is 89.  Her  weight is measured at 201 pounds with a body mass index of 34, placing her  into the mild obese category.  This patient is followed for her primary care  needs by Dr. Titus Dubin. Hopper.  She had complained about excessive daytime  sleepiness, loud snoring, periodic limb movements, nocturnal coughing and  gasping for air, recent weight gain, morning headaches and a history of  depression.   MEDICATIONS:  Medications include lorazepam and benazepril.   REASON FOR REFERRAL:  Kathleen Sellers is referred by her primary care physician  for evaluation of insomnia with possible sleep apnea, for a split-night  study.   The patient lists the above medications as the only ones she takes.  She is  not using any p.r.n. sleepers or p.r.n. pain pills.   DESCRIPTION:  The polysomnogram was started as a diagnostic study and after  observation of the first 90 minutes of sleep, the patient had tested  positive for sleep apnea with an apnea and hypopnea index that justified  initiation of CPAP treatment.  Without CPAP titration, the patient reached a  respiratory disturbance index of 79.5, causing significant arousal and  fragmentation of sleep and a barrier for the patient to advance into slow-  wave sleep or REM sleep.  As CPAP was initiated at 5 cmH2O water pressure,  the patient's obstructive apneas stepwise resolved.  Her respiratory  disturbance index became 3.6 per hour of sleep during 11-cm pressure with a  low  spontaneous arousal index, indicating that the patient could indeed  tolerate CPAP.  She has 1 central and 1 obstructive apnea in toto during a  sleep time of 122 minutes, which consisted also of 14 minutes of REM sleep.  Another good measure for successful apnea treatment is to observe the  patient during REM sleep.  Also, CPAP was increased in pressure to 13, 15  and 17 cm because of observed snoring and some residual hypopnea; it did not  lead to a lower respiratory disturbance index, which is now referred to as  apnea-hypopnea index (AHI).  It was during treatment with CPAP that the  patient could recover slow-wave sleep and REM sleep and that no longer  significant oxygen deficit desaturations were evident, which occurred before  in the diagnostic part of the study to a level below 80% nadir.  Please see  attached technical sheets and data sheets for verification.   CONCLUSION:  This patient should use between 11- and 13-cm pressure at home.  I would like her to be referred for an ultra-titration device to use at home  so that they can also find  a mask of choice for the patient.  A heated  humidifier will be added.  I would like the patient to follow up with me at  the [facility] on Wednesday  at [facility].  If Dr. Alwyn Ren permits, I will make all  the necessary referrals through my office.  The contact person is Alvino Blood at [phone].     ____________________________________________________________  Melvyn Novas, M.D.         Clinton D. Maple Hudson, M.D.  Interpreting Physician    CD/MEDQ  D:  12/01/2003 18:52:47  T:  12/02/2003 13:01:03  Job:  [id number]

## 2010-09-19 ENCOUNTER — Encounter: Payer: Self-pay | Admitting: Internal Medicine

## 2010-09-28 ENCOUNTER — Encounter: Payer: Self-pay | Admitting: Internal Medicine

## 2010-10-09 ENCOUNTER — Telehealth: Payer: Self-pay | Admitting: *Deleted

## 2010-10-09 MED ORDER — RANITIDINE HCL 150 MG PO CAPS: 150.0000 mg | ORAL_CAPSULE | Freq: Two times a day (BID) | ORAL | Status: DC

## 2010-10-09 NOTE — Telephone Encounter (Signed)
Pt states that she stopped omeprazole about 3 weeks ago because she heard some where that this med caused cancer. Pt is still c/o of daily symptoms of acid reflux and would like to know what med dr hopper would suggest her try which does not have warning of causing cancer.Please advise

## 2010-10-09 NOTE — Telephone Encounter (Signed)
Pt aware of change.

## 2010-10-09 NOTE — Telephone Encounter (Signed)
Ranitidine 150 mg every 12 hrs prn #60(costs $4 @ [employer] or [employer] if insurance NOT used)

## 2010-10-25 ENCOUNTER — Other Ambulatory Visit: Payer: Self-pay | Admitting: *Deleted

## 2010-10-25 MED ORDER — ROSUVASTATIN CALCIUM 40 MG PO TABS: 40.0000 mg | ORAL_TABLET | Freq: Every day | ORAL | Status: DC

## 2010-10-30 ENCOUNTER — Other Ambulatory Visit: Payer: Self-pay | Admitting: *Deleted

## 2010-10-30 MED ORDER — ROSUVASTATIN CALCIUM 40 MG PO TABS: 40.0000 mg | ORAL_TABLET | Freq: Every day | ORAL | Status: DC

## 2010-11-14 ENCOUNTER — Telehealth: Payer: Self-pay | Admitting: Internal Medicine

## 2010-11-14 NOTE — Telephone Encounter (Signed)
Pt's  Pharmacy faxed request two weeks ago per pt and never got filled, needs crestor 40 mg uses [facility] on [address]-pt wants to know since she's been off for town weeks should she stay off? If so, pls call [phone]

## 2010-11-15 ENCOUNTER — Other Ambulatory Visit: Payer: Self-pay | Admitting: *Deleted

## 2010-11-15 MED ORDER — ROSUVASTATIN CALCIUM 40 MG PO TABS: 40.0000 mg | ORAL_TABLET | Freq: Every day | ORAL | Status: DC

## 2010-11-15 NOTE — Telephone Encounter (Signed)
Pt Rx for Crestor sent to [facility] on [address].  Judithe Modest, CMA

## 2010-12-03 ENCOUNTER — Other Ambulatory Visit: Payer: Self-pay | Admitting: Internal Medicine

## 2011-02-05 ENCOUNTER — Ambulatory Visit (INDEPENDENT_AMBULATORY_CARE_PROVIDER_SITE_OTHER): Payer: Commercial Managed Care - PPO | Admitting: Internal Medicine

## 2011-02-05 ENCOUNTER — Telehealth: Payer: Self-pay

## 2011-02-05 ENCOUNTER — Encounter: Payer: Self-pay | Admitting: Internal Medicine

## 2011-02-05 DIAGNOSIS — E782 Mixed hyperlipidemia: Secondary | ICD-10-CM

## 2011-02-05 DIAGNOSIS — S098XXA Other specified injuries of head, initial encounter: Secondary | ICD-10-CM

## 2011-02-05 DIAGNOSIS — E039 Hypothyroidism, unspecified: Secondary | ICD-10-CM

## 2011-02-05 DIAGNOSIS — S0990XA Unspecified injury of head, initial encounter: Secondary | ICD-10-CM

## 2011-02-05 DIAGNOSIS — R7309 Other abnormal glucose: Secondary | ICD-10-CM

## 2011-02-05 DIAGNOSIS — R32 Unspecified urinary incontinence: Secondary | ICD-10-CM

## 2011-02-05 DIAGNOSIS — R51 Headache: Secondary | ICD-10-CM

## 2011-02-05 DIAGNOSIS — I10 Essential (primary) hypertension: Secondary | ICD-10-CM

## 2011-02-05 DIAGNOSIS — R55 Syncope and collapse: Secondary | ICD-10-CM

## 2011-02-05 DIAGNOSIS — N390 Urinary tract infection, site not specified: Secondary | ICD-10-CM

## 2011-02-05 LAB — POCT URINALYSIS DIPSTICK
Bilirubin, UA: NEGATIVE
Blood, UA: NEGATIVE
Glucose, UA: NEGATIVE
Nitrite, UA: NEGATIVE
Urobilinogen, UA: 0.2

## 2011-02-05 MED ORDER — TRAMADOL HCL 50 MG PO TABS: 50.0000 mg | ORAL_TABLET | Freq: Three times a day (TID) | ORAL | Status: AC | PRN

## 2011-02-05 NOTE — Progress Notes (Signed)
Subjective:    Patient ID: Kathleen Sellers, female    DOB: 1950/10/12, 60 y.o.   MRN: [id number]  HPI Syncope:02/03/2011  Approximately 4:30 pm . She had not eaten or drunk anything all day. Ambien temp was 75 degrees. Context: position change :while sitting on bench 10 minutesafter jumping up & down on trampoline type equpment for 5 minutes.  LOC/ Duration : 20 seconds  .   Cardiac Prodrome: heart racing/ heart irregularity/ palpitations : no but she felt "hot in face " & had orthopnnea   .  Neuro Prodrome:headache:since syncope; numbness & tingling : no  ; weakness : no.  Vertigo: no but vision was burred;gait dysfunction/falling: unsteady after event  ; tremor: no.   ROS:no  diaphoresis  ; chest pain .   Seizure activity: limb movement : no but eyes rolled back ;   urine / stool incontinence: no ; body injury:no.        HEADACHE : Onset: after syncope   Location: over crown &occiput  Quality: dull Frequency: daily  Prior treatment: NASAIDS (Note: 60 years old)  Associated Symptoms Nausea/vomiting: yes, intermittent nausea  Photophobia/phonophobia: no  Tearing of eyes: no  Sinus pain/pressure: no  Red Flags Fever: no  Neck pain/stiffness: yes, some stiffness  Vision/speech/swallow/hearing difficulty: no  Focal weakness/numbness: no  Altered mental status: no  Trauma: yes, she missed step 9/26 & struck head @ R occiput; no LOC  New type of headache: yes  Anticoagulant use: no, only 81 mg ASA    Review of Systems  She describes urinary incontinence for the last 2 weeks; but this preexisted the syncopal episode.  After the event 10/6, she had one episode of loose bowels.  She has had aching in her upper back ever since the syncopal episode.  She has noted blurring of the left eye for approximately a month  Today she had intense burning sensation in her spine and clammy palms  She denies dysuria, hematuria, or pyuria.     Objective:   Physical Exam Gen.: Healthy and  well-nourished in appearance. Alert, appropriate and cooperative throughout exam. Head: Normocephalic without obvious abnormalities;  Minor SQ R posterior scalp lesion Eyes: No corneal or conjunctival inflammation noted. Pupils asymmetric ; OD pupil off center Extraocular motion intact. Vision grossly blurred OS. FOV normal. Ears: External  ear exam reveals no significant lesions or deformities. Canals clear .TMs normal. Hearing is grossly normal bilaterally. Nose: External nasal exam reveals no deformity or inflammation. Nasal mucosa are pink and moist. No lesions or exudates noted.  Mouth: Oral mucosa and oropharynx reveal no lesions or exudates. Teeth in good repair. Neck: No deformities, masses, or tenderness noted. Range of motion &. Thyroid normal. Lungs: Normal respiratory effort; chest expands symmetrically. Lungs are clear to auscultation without rales, wheezes, or increased work of breathing. Heart: Normal rate and rhythm. Normal S1 and S2. No gallop, click, or rub. S4 w/o  murmur.                                                                                  Musculoskeletal/extremities: No deformity or scoliosis noted of  the thoracic or lumbar spine. No clubbing,  cyanosis, edema, or deformity noted. Tone & strength  normal.Joints normal. Nail health  good. Vascular: Carotid, radial artery, dorsalis pedis and  posterior tibial pulses are full and equal. No bruits present. Neurologic: Alert and oriented x3. Deep tendon reflexes symmetrical and normal. Gait, Romberg testing and finger-nose testing are all normal. There is slight asymmetry of the nasolabial folds.        Skin: Intact without suspicious lesions or rashes. Lymph: No cervical, axillary lymphadenopathy present. Psych: Mood and affect are normal. Normally interactive                                                                                           Assessment & Plan:  #1 head trauma 9/26 sustained in  a fall. No  loss of consciousness  #2 syncope ; history suggests heat exhaustion in the context of being n.p.o.  #3 urinary incontinence; this proceeded both #1 and #2 but by history has been worse following #1  #4 minor physical findings(see pupil asymmetry  and asymmetry of nasolabial  fold)   #5 she states her major concerns at this time are the weakness in the upper body and dull headache.  Plan: See orders and recommendations.

## 2011-02-05 NOTE — Telephone Encounter (Signed)
Patient called stating she passed out on Sat evening (not evaluated by medical professional), patient states she thinks she was just dehydrated due to not eating or drinking that whole day. Patient now c/o Headache since passing out (did not hit head, patient was sitting in chair and her head landed on her daughter), patient also with neck pain and burning sensation down back that lasted for 5 min. Patient was recommended to go to the emergency room or urgent care (refused). Schedule appointment with Dr.Hopper today at 4:15, patient aware if ANY change in symptoms she is to call 911, agreed

## 2011-02-05 NOTE — Patient Instructions (Signed)
Please keep a diary of your headaches . Document  each occurrence on the calendar with notation of : #1 any prodrome ( any non headache symptom such as marked fatigue,visual changes, ,etc ) which precedes actual headache ; #2) severity on 1-10 scale; #3) any triggers ( food/ drink,enviromenntal or weather changes ,physical or emotional stress) in 8-12 hour period prior to the headache; & #4) response to any medications or other intervention. Please review "Headache" @ [facility] for additional information.    Please  schedule fasting Labs : BMET,Lipids, hepatic panel, CBC & dif, TSH( see Diagnoses for Codes).  Please bring these instructions to that Lab appt.

## 2011-02-06 ENCOUNTER — Other Ambulatory Visit: Payer: Commercial Managed Care - PPO

## 2011-02-07 DIAGNOSIS — G319 Degenerative disease of nervous system, unspecified: Secondary | ICD-10-CM

## 2011-02-07 HISTORY — DX: Degenerative disease of nervous system, unspecified: G31.9

## 2011-02-08 LAB — URINE CULTURE: Colony Count: 2000

## 2011-02-13 ENCOUNTER — Encounter: Payer: Self-pay | Admitting: Internal Medicine

## 2011-02-14 ENCOUNTER — Encounter: Payer: Self-pay | Admitting: Internal Medicine

## 2011-02-14 ENCOUNTER — Ambulatory Visit (INDEPENDENT_AMBULATORY_CARE_PROVIDER_SITE_OTHER): Payer: Commercial Managed Care - PPO | Admitting: Internal Medicine

## 2011-02-14 DIAGNOSIS — R3915 Urgency of urination: Secondary | ICD-10-CM

## 2011-02-14 DIAGNOSIS — H21569 Pupillary abnormality, unspecified eye: Secondary | ICD-10-CM

## 2011-02-14 DIAGNOSIS — E039 Hypothyroidism, unspecified: Secondary | ICD-10-CM

## 2011-02-14 MED ORDER — LEVOTHYROXINE SODIUM 25 MCG PO TABS: 25.0000 ug | ORAL_TABLET | ORAL | Status: DC

## 2011-02-14 MED ORDER — DARIFENACIN HYDROBROMIDE ER 15 MG PO TB24: 15.0000 mg | ORAL_TABLET | Freq: Every day | ORAL | Status: AC

## 2011-02-14 NOTE — Patient Instructions (Addendum)
TSH (Thyroid Stimulating Hormone) normal range = 0.35- 5.50. Ideal value is 1-3. A  Value below 0.35 indicates excessive thyroid supplementation (HYPERthyroid state) & a Value > 5.50 indicates inadequate replacement.(HYPOthyroid state) Either extreme can have adverse long  term effects.     Eat a low-fat diet with lots of fruits and vegetables, up to 7-9 servings per day. Consume less than 30 grams of sugar per day from foods & drinks with High Fructose Corn Sugar as #1,2,3 or # 4 on label. Follow the low carb nutrition program in [employer] as closely as possible to prevent Diabetes progression & complications. White carbohydrates (potatoes, rice, bread, and pasta) have a high spike of sugar and a high load of sugar. For example a  baked potato has a cup of sugar and a  french fry  2 teaspoons of sugar. Yams, wild  rice, whole grained bread &  wheat pasta have been much lower spike and load of  sugar. Portions should be the size of a deck of cards or your palm.  Please  schedule labs in 10-12 weeks : TSH, A1c ( 244.9, 790.29).  Please bring these instructions to that Lab appt.

## 2011-02-14 NOTE — Progress Notes (Signed)
  Subjective:    Patient ID: Kathleen Sellers, female    DOB: 1951-04-22, 60 y.o.   MRN: [id number]  HPI   Kathleen Sellers is here for followup. She remains fatigued. Her TSH is 5.75 on a chronic  dose of thyroid of 25 mcg once daily.   Her urinary symptoms persist as urgency. She did experience enuresis 10/13 while dreaming about urinating prior to leaving house.  Her CT scan results were reviewed. This was done because of recent head trauma as noted in her prior office visit note. There was no evidence of bleed or subdural hematoma. She did have mild cerebral atrophy most prominent in the frontal  temporal areas. This seemed accentuated for age.    Review of Systems     Objective:   Physical Exam  She is alert and oriented, well-nourished; in no acute distress Eyes: Extraocular motion intact; no lid lag or proptosis; asymmetry of OS pupil Heart: Normal rhythm and rate without significant murmur, gallop.soft S 4 with slight slurring Lungs: Chest clear to auscultation without rales,rales, wheezes Neuro:Deep tendon reflexes are equal and within normal limits; no tremor  Skin: Warm and dry without significant lesions or rashes; no onycholysis Psych: Normally communicative and interactive; no abnormal mood or affect clinically.   Mini-Mental Status examination: Completely normal, score of 30. Answers were precise and responses immediate.       Assessment & Plan:  #1 hypothyroidism; suboptimal replacement. TSH goal is 1-3  #2 history of recent head trauma; no sequelae noted on CT scan. Atrophy present partially accentuated for age. No evidence of any mental status alteration  #3 urgency and isolated enuresis.   #4 asymmetric left pupil, question significance, possibly postoperative  Plan: #1 increase thyroid replacement  #2 urology patient recommended for frequency if no better with trial of Enablex. Enuresis  was in the context of dreaming about rushing to urinate prior to leaving  her home.  #3 followup at least annually with ophthalmology

## 2011-02-21 ENCOUNTER — Encounter: Payer: Self-pay | Admitting: Internal Medicine

## 2011-03-07 ENCOUNTER — Ambulatory Visit: Payer: Commercial Managed Care - PPO | Admitting: Internal Medicine

## 2011-03-29 ENCOUNTER — Other Ambulatory Visit: Payer: Self-pay | Admitting: Internal Medicine

## 2011-04-02 ENCOUNTER — Other Ambulatory Visit: Payer: Self-pay | Admitting: Internal Medicine

## 2011-04-02 MED ORDER — METOPROLOL TARTRATE 100 MG PO TABS: 100.0000 mg | ORAL_TABLET | ORAL | Status: DC

## 2011-04-02 NOTE — Telephone Encounter (Signed)
RX sent

## 2011-04-06 ENCOUNTER — Encounter (INDEPENDENT_AMBULATORY_CARE_PROVIDER_SITE_OTHER): Payer: Commercial Managed Care - PPO | Admitting: Ophthalmology

## 2011-04-06 DIAGNOSIS — H43819 Vitreous degeneration, unspecified eye: Secondary | ICD-10-CM

## 2011-04-06 DIAGNOSIS — I10 Essential (primary) hypertension: Secondary | ICD-10-CM

## 2011-04-06 DIAGNOSIS — H35039 Hypertensive retinopathy, unspecified eye: Secondary | ICD-10-CM

## 2011-04-06 DIAGNOSIS — H35379 Puckering of macula, unspecified eye: Secondary | ICD-10-CM

## 2011-04-10 ENCOUNTER — Encounter (INDEPENDENT_AMBULATORY_CARE_PROVIDER_SITE_OTHER): Payer: Commercial Managed Care - PPO | Admitting: Ophthalmology

## 2011-05-09 ENCOUNTER — Other Ambulatory Visit: Payer: Self-pay | Admitting: Internal Medicine

## 2011-05-09 MED ORDER — RANITIDINE HCL 150 MG PO CAPS: 150.0000 mg | ORAL_CAPSULE | Freq: Two times a day (BID) | ORAL | Status: DC

## 2011-05-09 NOTE — Telephone Encounter (Signed)
RX sent

## 2011-05-18 ENCOUNTER — Encounter (INDEPENDENT_AMBULATORY_CARE_PROVIDER_SITE_OTHER): Payer: Commercial Managed Care - PPO | Admitting: Ophthalmology

## 2011-05-24 ENCOUNTER — Encounter (INDEPENDENT_AMBULATORY_CARE_PROVIDER_SITE_OTHER): Payer: Commercial Managed Care - PPO | Admitting: Ophthalmology

## 2011-05-24 DIAGNOSIS — I10 Essential (primary) hypertension: Secondary | ICD-10-CM

## 2011-05-24 DIAGNOSIS — H43819 Vitreous degeneration, unspecified eye: Secondary | ICD-10-CM

## 2011-05-24 DIAGNOSIS — H35039 Hypertensive retinopathy, unspecified eye: Secondary | ICD-10-CM

## 2011-05-24 DIAGNOSIS — H35379 Puckering of macula, unspecified eye: Secondary | ICD-10-CM

## 2011-05-24 DIAGNOSIS — H35359 Cystoid macular degeneration, unspecified eye: Secondary | ICD-10-CM

## 2011-05-24 DIAGNOSIS — H43319 Vitreous membranes and strands, unspecified eye: Secondary | ICD-10-CM

## 2011-05-25 ENCOUNTER — Other Ambulatory Visit (INDEPENDENT_AMBULATORY_CARE_PROVIDER_SITE_OTHER): Payer: Self-pay | Admitting: Ophthalmology

## 2011-05-25 DIAGNOSIS — H35379 Puckering of macula, unspecified eye: Secondary | ICD-10-CM

## 2011-05-25 NOTE — H&P (Signed)
Kathleen Sellers is an 61 y.o. female.   Chief Complaint: Loss of vision Left eye  HPI: Loss of vision with dense floaters in the vision  Past Medical History  Diagnosis Date  . Hyperglycemia   . CAD (coronary artery disease)   . Hypertension   . Hypothyroidism   . Depression   . Colon polyps   . Hyperlipidemia   . Cerebral atrophy 02/07/2011    done to assess head trauma ( [facility])    Past Surgical History  Procedure Date  . Cardiac catheterization 2003  . Oophorectomy 1970    benign tumor  . Appendectomy   . Cataract extraction   . Cholecystectomy 2005  . Colonoscopy w/ polypectomy 2006    Dr. Evette Cristal  . Hysterectomy & uso 1995  . Nasal sinus surgery 1988    Dr. Haroldine Laws    Family History  Problem Relation Age of Onset  . Other Father     carebral hemorrhage  . Diabetes Mother   . Hypertension Mother   . Lung cancer Mother     with bone metastases  . Cancer Mother     lung cancer with bone metastases  . Coronary artery disease Paternal Uncle   . Breast cancer Maternal Grandmother   . Cancer Maternal Grandmother     breast   Social History:  reports that she has never smoked. She does not have any smokeless tobacco history on file. She reports that she does not drink alcohol or use illicit drugs.  Allergies:  Allergies  Allergen Reactions  . Sulfamethoxazole W/Trimethoprim     Rash   . Sulfonamide Derivatives     rash  . Cefaclor     Itching w/o rash    No current facility-administered medications on file as of .   Medications Prior to Admission  Medication Sig Dispense Refill  . aspirin 81 MG tablet Take 81 mg by mouth daily.        . benazepril (LOTENSIN) 5 MG tablet TAKE 1 TABLET BY MOUTH DAILY  90 tablet  1  . escitalopram (LEXAPRO) 20 MG tablet Take 20 mg by mouth daily.        . furosemide (LASIX) 40 MG tablet Take 1 tablet (40 mg total) by mouth daily.  90 tablet  2  . levothyroxine (SYNTHROID, LEVOTHROID) 25 MCG tablet Take 1  tablet (25 mcg total) by mouth as directed. One pill daily except 1-1/2 Tues, Thursday, and Saturday  90 tablet  3  . LORazepam (ATIVAN) 2 MG tablet Take 2 mg by mouth. 1/2 by mouth twice daily       . metoprolol (LOPRESSOR) 100 MG tablet Take 1 tablet (100 mg total) by mouth as directed. 0.25 tabs daily   90 tablet  1  . omeprazole (PRILOSEC) 20 MG capsule Take 20 mg by mouth daily.        . ranitidine (ZANTAC) 150 MG capsule Take 1 capsule (150 mg total) by mouth 2 (two) times daily.  60 capsule  5  . rosuvastatin (CRESTOR) 40 MG tablet Take 1 tablet (40 mg total) by mouth daily.  30 tablet  6  . traMADol (ULTRAM) 50 MG tablet Take 1 tablet (50 mg total) by mouth every 8 (eight) hours as needed for pain.  20 tablet  0    Review of systems otherwise negative  There were no vitals taken for this visit.  Physical exam: Mental status: oriented x3. Eyes: See eye exam associated with this  surgery on this date.  Look in Media.  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

## 2011-06-08 ENCOUNTER — Other Ambulatory Visit: Payer: Self-pay | Admitting: Internal Medicine

## 2011-06-08 MED ORDER — FUROSEMIDE 40 MG PO TABS: 40.0000 mg | ORAL_TABLET | Freq: Every day | ORAL | Status: DC

## 2011-06-08 NOTE — Telephone Encounter (Signed)
Dr.Hopper sent rx

## 2011-06-08 NOTE — Telephone Encounter (Signed)
Dr.Hopper please advise on refill request, when filling:  drug-interaction report populated stating patient allergic to sulfa and that is a class of the listed allergen. Report printed and placed on ledge for review .

## 2011-06-08 NOTE — Telephone Encounter (Signed)
She's been on this medicine without any ill effect refill x3 months

## 2011-06-09 ENCOUNTER — Other Ambulatory Visit: Payer: Self-pay | Admitting: Internal Medicine

## 2011-06-11 NOTE — Telephone Encounter (Signed)
TSH, A1c ( 244.9, 790.29)

## 2011-06-14 ENCOUNTER — Encounter (HOSPITAL_COMMUNITY): Admission: RE | Payer: Self-pay | Source: Ambulatory Visit

## 2011-06-14 ENCOUNTER — Ambulatory Visit (HOSPITAL_COMMUNITY)
Admission: RE | Admit: 2011-06-14 | Payer: Commercial Managed Care - PPO | Source: Ambulatory Visit | Admitting: Ophthalmology

## 2011-06-14 SURGERY — PARS PLANA VITRECTOMY WITH 25 GAUGE
Anesthesia: General | Laterality: Left

## 2011-06-21 ENCOUNTER — Inpatient Hospital Stay (INDEPENDENT_AMBULATORY_CARE_PROVIDER_SITE_OTHER): Payer: Commercial Managed Care - PPO | Admitting: Ophthalmology

## 2011-06-22 ENCOUNTER — Other Ambulatory Visit: Payer: Self-pay | Admitting: Internal Medicine

## 2011-06-29 ENCOUNTER — Telehealth: Payer: Self-pay | Admitting: Internal Medicine

## 2011-06-29 MED ORDER — LEVOTHYROXINE SODIUM 25 MCG PO TABS: 25.0000 ug | ORAL_TABLET | Freq: Every day | ORAL | Status: DC

## 2011-06-29 NOTE — Telephone Encounter (Signed)
Please refill   levothyroxine (SYNTHROID, LEVOTHROID) 25 MCG tablet   Qty 30  Last fill date 05/27/11  Karin Golden # [phone] Thanks

## 2011-06-29 NOTE — Telephone Encounter (Signed)
Prescription sent with no refills.  Needs bloodwork.

## 2011-08-01 ENCOUNTER — Other Ambulatory Visit: Payer: Self-pay | Admitting: Internal Medicine

## 2011-08-01 DIAGNOSIS — R7309 Other abnormal glucose: Secondary | ICD-10-CM

## 2011-08-01 DIAGNOSIS — E039 Hypothyroidism, unspecified: Secondary | ICD-10-CM

## 2011-08-01 MED ORDER — LEVOTHYROXINE SODIUM 25 MCG PO TABS: 25.0000 ug | ORAL_TABLET | Freq: Every day | ORAL | Status: DC

## 2011-08-01 NOTE — Telephone Encounter (Signed)
Refill for  Levothyroxine Sodium 0.025MG  Tab Qty 30 Take 1-tablet (25 MCG Total) by mouth daily Last filled 3.1.13  *NOTE* last refill 3.1.13-stated needs labs, no appointment showing.

## 2011-08-01 NOTE — Telephone Encounter (Signed)
Please schedule labs in 10-12 weeks : TSH, A1c ( 244.9, 790.29). (FUTURE ORDERS PLACED) Copied from 01/2011 OV instructions

## 2011-08-22 ENCOUNTER — Other Ambulatory Visit: Payer: Self-pay | Admitting: Internal Medicine

## 2011-08-22 NOTE — Telephone Encounter (Signed)
Refilled crestor 

## 2011-08-27 ENCOUNTER — Other Ambulatory Visit: Payer: Self-pay | Admitting: Internal Medicine

## 2011-09-27 ENCOUNTER — Ambulatory Visit: Payer: Commercial Managed Care - PPO | Admitting: Internal Medicine

## 2011-09-29 ENCOUNTER — Other Ambulatory Visit: Payer: Self-pay | Admitting: Internal Medicine

## 2011-10-01 ENCOUNTER — Telehealth: Payer: Self-pay | Admitting: Internal Medicine

## 2011-10-01 MED ORDER — LEVOTHYROXINE SODIUM 25 MCG PO TABS: 25.0000 ug | ORAL_TABLET | Freq: Every day | ORAL | Status: DC

## 2011-10-01 NOTE — Telephone Encounter (Signed)
Rx sent 

## 2011-10-01 NOTE — Telephone Encounter (Signed)
Refill- levothyroxine sodium 0.025mg  tab. Take one tablet ( total) by mouth daily. Qty 30 last fill 5.3.13

## 2011-10-07 ENCOUNTER — Other Ambulatory Visit: Payer: Self-pay | Admitting: Internal Medicine

## 2011-11-02 ENCOUNTER — Other Ambulatory Visit: Payer: Self-pay | Admitting: Internal Medicine

## 2011-11-05 ENCOUNTER — Ambulatory Visit: Payer: Commercial Managed Care - PPO | Admitting: Internal Medicine

## 2011-12-02 ENCOUNTER — Other Ambulatory Visit: Payer: Self-pay | Admitting: Internal Medicine

## 2011-12-03 ENCOUNTER — Other Ambulatory Visit: Payer: Self-pay | Admitting: Internal Medicine

## 2011-12-03 NOTE — Telephone Encounter (Signed)
Patient needs to schedule a CPX in 01/2012

## 2011-12-27 ENCOUNTER — Ambulatory Visit (INDEPENDENT_AMBULATORY_CARE_PROVIDER_SITE_OTHER): Payer: Commercial Managed Care - PPO | Admitting: Internal Medicine

## 2011-12-27 ENCOUNTER — Ambulatory Visit: Payer: Commercial Managed Care - PPO | Admitting: Internal Medicine

## 2011-12-27 ENCOUNTER — Encounter: Payer: Self-pay | Admitting: Internal Medicine

## 2011-12-27 VITALS — BP 117/75 | HR 55 | Ht 64.0 in | Wt 199.0 lb

## 2011-12-27 DIAGNOSIS — I2510 Atherosclerotic heart disease of native coronary artery without angina pectoris: Secondary | ICD-10-CM

## 2011-12-27 MED ORDER — FUROSEMIDE 40 MG PO TABS: 40.0000 mg | ORAL_TABLET | Freq: Every day | ORAL | Status: DC

## 2011-12-27 MED ORDER — ROSUVASTATIN CALCIUM 40 MG PO TABS: 40.0000 mg | ORAL_TABLET | Freq: Every day | ORAL | Status: DC

## 2011-12-27 MED ORDER — BENAZEPRIL HCL 5 MG PO TABS: 5.0000 mg | ORAL_TABLET | Freq: Every day | ORAL | Status: DC

## 2011-12-27 MED ORDER — METOPROLOL TARTRATE 100 MG PO TABS: 100.0000 mg | ORAL_TABLET | ORAL | Status: DC

## 2011-12-27 NOTE — Patient Instructions (Signed)
Please have your lab work faxed to [place] at [phone]   Your physician wants you to follow-up in:12 months You will receive a reminder letter in the mail two months in advance. If you don't receive a letter, please call our office to schedule the follow-up appointment.

## 2011-12-27 NOTE — Progress Notes (Signed)
HPIMs. Kathleen Sellers is a 61 year old woman with a history of CAD(cath 2003: 50 to 60% LAD; 60% OM2; 20% RCA), dyslipidemia, hypothyroidism, hypertension, . Since seen she has done well from a cardiac stanpoint. She denies SOB, CP, Dizziness, palptitations.   Started Zumba a couple weeks ago.  Needs    Allergies  Allergen Reactions  . Sulfamethoxazole W-Trimethoprim     Rash   . Sulfonamide Derivatives     rash  . Cefaclor     Itching w/o rash    Current Outpatient Prescriptions  Medication Sig Dispense Refill  . aspirin 81 MG tablet Take 81 mg by mouth daily.        . benazepril (LOTENSIN) 5 MG tablet TAKE 1 TABLET BY MOUTH DAILY  90 tablet  1  . escitalopram (LEXAPRO) 20 MG tablet Take 20 mg by mouth daily.        . furosemide (LASIX) 40 MG tablet TAKE 1 TABLET (40 MG TOTAL) BY MOUTH DAILY.  90 tablet  0  . levothyroxine (SYNTHROID, LEVOTHROID) 25 MCG tablet TAKE 1 TABLET (25 MCG TOTAL) BY MOUTH DAILY.  -- NEED LABWORK FOR REFILLS  30 tablet  0  . LORazepam (ATIVAN) 2 MG tablet Take 2 mg by mouth. 1/2 by mouth twice daily       . metoprolol (LOPRESSOR) 100 MG tablet Take 1 tablet (100 mg total) by mouth as directed. 0.25 tabs daily   90 tablet  1  . ranitidine (ZANTAC) 150 MG tablet TAKE 1 TABLET BY MOUTH TWICE DAILY  60 tablet  1  . rosuvastatin (CRESTOR) 40 MG tablet Take 1 tablet (40 mg total) by mouth daily.  30 tablet  6  . traMADol (ULTRAM) 50 MG tablet Take 1 tablet (50 mg total) by mouth every 8 (eight) hours as needed for pain.  20 tablet  0  . DISCONTD: CRESTOR 40 MG tablet TAKE 1 TABLET (40 MG TOTAL) BY MOUTH DAILY.  30 tablet  6    Past Medical History  Diagnosis Date  . Hyperglycemia   . CAD (coronary artery disease)   . Hypertension   . Hypothyroidism   . Depression   . Colon polyps   . Hyperlipidemia   . Cerebral atrophy 02/07/2011    done to assess head trauma ( [facility])    Past Surgical History  Procedure Date  . Cardiac catheterization 2003    . Oophorectomy 1970    benign tumor  . Appendectomy   . Cataract extraction   . Cholecystectomy 2005  . Colonoscopy w/ polypectomy 2006    Dr. Evette Cristal  . Hysterectomy & uso 1995  . Nasal sinus surgery 1988    Dr. Haroldine Laws    Family History  Problem Relation Age of Onset  . Other Father     carebral hemorrhage  . Diabetes Mother   . Hypertension Mother   . Lung cancer Mother     with bone metastases  . Cancer Mother     lung cancer with bone metastases  . Coronary artery disease Paternal Uncle   . Breast cancer Maternal Grandmother   . Cancer Maternal Grandmother     breast    History   Social History  . Marital Status: Married    Spouse Name: N/A    Number of Children: 3  . Years of Education: N/A   Occupational History  . 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 Smoking status: Never Smoker   .  Smokeless tobacco: Not on file  . Alcohol Use: No  . Drug Use: No  . Sexually Active:    Other Topics Concern  . Not on file   Social History Narrative   No reg exercise    Review of Systems:  All systems reviewed.  They are negative to the above problem except as previously stated.  Vital Signs: BP 117/75  Pulse 55  Ht 5\' 4"  (1.626 m)  Wt 199 lb (90.266 kg)  BMI 34.16 kg/m2  Physical Exam  HEENT:  Normocephalic, atraumatic. EOMI, PERRLA.  Neck: JVP is normal.  No bruits.  Lungs: clear to auscultation. No rales no wheezes.  Heart: Regular rate and rhythm. Normal S1, S2. No S3.   No significant murmurs. PMI not displaced.  Abdomen:  Supple, nontender. Normal bowel sounds. No masses. No hepatomegaly.  Extremities:   Good distal pulses throughout. No lower extremity edema.  Musculoskeletal :moving all extremities.  Neuro:   alert and oriented x3.  CN II-XII grossly intact.  EKG:  SB  54 bpm.  T wave inversion III, V1-V3  Assessment and Plan:  1.  CAD.  No symptoms of angina.  Follow 2.  HTN  GOod control  3.  HL  WIll need to   Get lipids.

## 2011-12-28 ENCOUNTER — Other Ambulatory Visit: Payer: Self-pay | Admitting: *Deleted

## 2012-01-03 ENCOUNTER — Encounter: Payer: Self-pay | Admitting: Internal Medicine

## 2012-01-18 ENCOUNTER — Telehealth: Payer: Self-pay | Admitting: Internal Medicine

## 2012-01-18 ENCOUNTER — Telehealth: Payer: Self-pay | Admitting: *Deleted

## 2012-01-18 DIAGNOSIS — E782 Mixed hyperlipidemia: Secondary | ICD-10-CM

## 2012-01-18 MED ORDER — EZETIMIBE 10 MG PO TABS: 10.0000 mg | ORAL_TABLET | Freq: Every day | ORAL | Status: DC

## 2012-01-18 NOTE — Telephone Encounter (Signed)
Returned call to pt. She is not sure now about starting Zetia. Explained to her what this medication was for. "I'm just on so many medications now".  She may rethink this. She is working on her diet. Mylo Red RN

## 2012-01-18 NOTE — Telephone Encounter (Signed)
I spoke with pt this morning after Dr. Tenny Craw reviewed her recent lab results. Pt agrees to start Zetia and return to [facility] for lipids in 8 weeks. Order mailed to pt for lipid level Mylo Red RN

## 2012-01-18 NOTE — Telephone Encounter (Signed)
New Problem: ° ° ° °Patient returned your call.  Please call back. °

## 2012-02-09 ENCOUNTER — Other Ambulatory Visit: Payer: Self-pay | Admitting: Internal Medicine

## 2012-02-11 NOTE — Telephone Encounter (Signed)
Patient needs to schedule a CPX  

## 2012-02-12 ENCOUNTER — Other Ambulatory Visit: Payer: Self-pay

## 2012-02-12 NOTE — Telephone Encounter (Signed)
Pt states you were requiring her to have labs done before refills could be done. Pt states labs at [facility] should be in the system by now in need of Rxs.  Plz advise    MW

## 2012-02-12 NOTE — Telephone Encounter (Signed)
Zantac can be refilled for 3 months. There is no TSH in the chart for over 20 months. There are no records from [facility] or the cardiologist concerning her TSH.  Last seen 02/14/11.TSH needs to be drawn  & OV scheduled. Code: 244.9

## 2012-02-13 MED ORDER — LEVOTHYROXINE SODIUM 25 MCG PO TABS: ORAL_TABLET | ORAL | Status: DC

## 2012-02-13 MED ORDER — RANITIDINE HCL 150 MG PO TABS: ORAL_TABLET | ORAL | Status: DC

## 2012-02-13 NOTE — Telephone Encounter (Signed)
I sent in RX for ranitidine, I called patient at number given (work number) and she was with a patient. I will try to reach patient again later

## 2012-02-13 NOTE — Telephone Encounter (Signed)
I called patient on home number, patient states that she is certain that Dr.Ross ordered TSH and she will have them fax results to [place] tomorrow.  Per Dr.Hopper give #30, we will be expecting labs to be here when he returns in office on Monday 02/18/2012

## 2012-03-18 ENCOUNTER — Telehealth: Payer: Self-pay | Admitting: *Deleted

## 2012-03-18 NOTE — Telephone Encounter (Signed)
Patient called the office and stated she fell at home and hit the top corner of her eye. She does have some swelling and bruising but no bleeding, blurred vision and dizziness at this time. I advised the patient to apply ice packs to the area on and off for the remainder of the night and to call the office to be seen if no improvement in symptoms by Thurs morning. She verbalized understanding and will call back if needed. SGJ, RN

## 2012-03-26 ENCOUNTER — Other Ambulatory Visit: Payer: Self-pay | Admitting: Internal Medicine

## 2012-03-28 NOTE — Telephone Encounter (Signed)
Rx sent for 30 day supply. Pt need OV last OV 02/14/11.   MW

## 2012-04-09 ENCOUNTER — Other Ambulatory Visit: Payer: Self-pay | Admitting: Internal Medicine

## 2012-04-10 NOTE — Telephone Encounter (Signed)
Pending appointment 05/2012 

## 2012-05-10 ENCOUNTER — Other Ambulatory Visit: Payer: Self-pay | Admitting: Internal Medicine

## 2012-06-07 ENCOUNTER — Other Ambulatory Visit: Payer: Self-pay | Admitting: Internal Medicine

## 2012-06-09 NOTE — Telephone Encounter (Signed)
Future orders already placed

## 2012-06-11 ENCOUNTER — Encounter: Payer: Commercial Managed Care - PPO | Admitting: Internal Medicine

## 2012-06-20 ENCOUNTER — Telehealth: Payer: Self-pay | Admitting: *Deleted

## 2012-06-20 MED ORDER — BENAZEPRIL HCL 5 MG PO TABS: ORAL_TABLET | ORAL | Status: DC

## 2012-06-20 MED ORDER — LEVOTHYROXINE SODIUM 25 MCG PO TABS: ORAL_TABLET | ORAL | Status: DC

## 2012-06-20 NOTE — Telephone Encounter (Signed)
Left Pt detail message Rx sent

## 2012-06-20 NOTE — Telephone Encounter (Signed)
Refill #90 

## 2012-06-20 NOTE — Telephone Encounter (Signed)
Last OV 02-14-11 Pt has pending CPX 09-03-12,  .Please advise ok to fill med until then

## 2012-07-18 ENCOUNTER — Other Ambulatory Visit: Payer: Self-pay | Admitting: Internal Medicine

## 2012-07-18 ENCOUNTER — Telehealth: Payer: Self-pay | Admitting: Internal Medicine

## 2012-07-18 NOTE — Telephone Encounter (Signed)
Pending appointment May 2014 

## 2012-07-18 NOTE — Telephone Encounter (Signed)
ALSO REFILL ON RANITIDINE HCL150 MG TAB #60  SIG: TAKE 1 TABLET BY MOUTH TWICE  DAILY   LAST FILLED ON 01.13.2014

## 2012-07-18 NOTE — Telephone Encounter (Signed)
Duplicate Request, rx responded to already via electronic

## 2012-07-21 ENCOUNTER — Telehealth: Payer: Self-pay | Admitting: *Deleted

## 2012-09-03 ENCOUNTER — Encounter: Payer: Commercial Managed Care - PPO | Admitting: Internal Medicine

## 2012-09-11 ENCOUNTER — Other Ambulatory Visit: Payer: Self-pay | Admitting: Internal Medicine

## 2012-09-11 ENCOUNTER — Telehealth: Payer: Self-pay | Admitting: General Practice

## 2012-09-11 DIAGNOSIS — I10 Essential (primary) hypertension: Secondary | ICD-10-CM

## 2012-09-11 DIAGNOSIS — R7309 Other abnormal glucose: Secondary | ICD-10-CM

## 2012-09-11 DIAGNOSIS — E782 Mixed hyperlipidemia: Secondary | ICD-10-CM

## 2012-09-11 DIAGNOSIS — E039 Hypothyroidism, unspecified: Secondary | ICD-10-CM

## 2012-09-11 MED ORDER — RANITIDINE HCL 150 MG PO TABS: ORAL_TABLET | ORAL | Status: DC

## 2012-09-11 MED ORDER — BENAZEPRIL HCL 5 MG PO TABS: ORAL_TABLET | ORAL | Status: DC

## 2012-09-11 MED ORDER — LEVOTHYROXINE SODIUM 25 MCG PO TABS: ORAL_TABLET | ORAL | Status: DC

## 2012-09-11 NOTE — Telephone Encounter (Signed)
Pt called wanting to know if there was a possibility to get her meds filled. Pt is suppose to have an appointment and labs before meds are to be filled. Pt is going to [facility] in the morning to have labs drawn. Meds filled until July appt.

## 2012-09-12 ENCOUNTER — Other Ambulatory Visit (INDEPENDENT_AMBULATORY_CARE_PROVIDER_SITE_OTHER): Payer: Commercial Managed Care - PPO

## 2012-09-12 DIAGNOSIS — E782 Mixed hyperlipidemia: Secondary | ICD-10-CM

## 2012-09-12 DIAGNOSIS — I10 Essential (primary) hypertension: Secondary | ICD-10-CM

## 2012-09-12 DIAGNOSIS — E039 Hypothyroidism, unspecified: Secondary | ICD-10-CM

## 2012-09-12 DIAGNOSIS — R7309 Other abnormal glucose: Secondary | ICD-10-CM

## 2012-09-12 LAB — BASIC METABOLIC PANEL
CO2: 29 mEq/L (ref 19–32)
Chloride: 106 mEq/L (ref 96–112)
Glucose, Bld: 97 mg/dL (ref 70–99)
Potassium: 3.8 mEq/L (ref 3.5–5.1)
Sodium: 140 mEq/L (ref 135–145)

## 2012-09-12 LAB — LIPID PANEL: Total CHOL/HDL Ratio: 3

## 2012-09-24 ENCOUNTER — Telehealth: Payer: Self-pay | Admitting: *Deleted

## 2012-09-24 MED ORDER — METOPROLOL SUCCINATE ER 100 MG PO TB24: 100.0000 mg | ORAL_TABLET | Freq: Every day | ORAL | Status: DC

## 2012-09-24 NOTE — Telephone Encounter (Signed)
OK until scheduled CPX

## 2012-09-24 NOTE — Telephone Encounter (Signed)
Last OV 02-14-11, last filled 04-02-11 #90 1

## 2012-09-24 NOTE — Telephone Encounter (Signed)
Rx sent 

## 2012-09-25 ENCOUNTER — Telehealth: Payer: Self-pay | Admitting: *Deleted

## 2012-09-25 NOTE — Telephone Encounter (Signed)
Patient was on lopressor  25 mg are we switching to toprol XL 100 mg. Please advise per our records Pt was previously on metoprolol (LOPRESSOR) 100 MG tablet Take 1 tablet (100 mg total) by mouth as directed. 0.25 tabs daily

## 2012-09-25 NOTE — Telephone Encounter (Signed)
Called Pt to clarify dosing. Pt states that med is at home and she is unaware of dosing. Pt will get info and give [place] a call back on tomorrow with info.

## 2012-09-29 MED ORDER — METOPROLOL SUCCINATE ER 25 MG PO TB24: 25.0000 mg | ORAL_TABLET | Freq: Every day | ORAL | Status: DC

## 2012-09-29 NOTE — Telephone Encounter (Signed)
Refill #90 of Metoprolol 25 , 1 qd

## 2012-09-29 NOTE — Telephone Encounter (Signed)
Rx sent 

## 2012-09-29 NOTE — Telephone Encounter (Signed)
Pt states that she is currently taking 25 mg of the metoprolol.Please advise

## 2012-10-07 ENCOUNTER — Other Ambulatory Visit: Payer: Self-pay | Admitting: Internal Medicine

## 2012-10-08 ENCOUNTER — Other Ambulatory Visit: Payer: Self-pay | Admitting: Internal Medicine

## 2012-10-20 ENCOUNTER — Other Ambulatory Visit: Payer: Self-pay | Admitting: Internal Medicine

## 2012-11-08 ENCOUNTER — Other Ambulatory Visit: Payer: Self-pay | Admitting: Internal Medicine

## 2012-11-13 ENCOUNTER — Other Ambulatory Visit: Payer: Self-pay | Admitting: Internal Medicine

## 2012-11-14 NOTE — Telephone Encounter (Signed)
Pending OV 07/30//14

## 2012-11-26 ENCOUNTER — Encounter: Payer: Self-pay | Admitting: Internal Medicine

## 2012-11-26 ENCOUNTER — Other Ambulatory Visit: Payer: Self-pay | Admitting: Internal Medicine

## 2012-11-26 ENCOUNTER — Ambulatory Visit (INDEPENDENT_AMBULATORY_CARE_PROVIDER_SITE_OTHER): Payer: Commercial Managed Care - PPO | Admitting: Internal Medicine

## 2012-11-26 VITALS — BP 122/64 | HR 63 | Resp 12 | Ht 63.08 in | Wt 179.0 lb

## 2012-11-26 DIAGNOSIS — Z0000 Encounter for general adult medical examination without abnormal findings: Secondary | ICD-10-CM

## 2012-11-26 DIAGNOSIS — J328 Other chronic sinusitis: Secondary | ICD-10-CM

## 2012-11-26 LAB — CBC WITH DIFFERENTIAL/PLATELET
Basophils Relative: 0.3 % (ref 0.0–3.0)
Eosinophils Relative: 3.9 % (ref 0.0–5.0)
HCT: 42.1 % (ref 36.0–46.0)
Hemoglobin: 14.2 g/dL (ref 12.0–15.0)
Lymphs Abs: 1.7 10*3/uL (ref 0.7–4.0)
MCV: 90.4 fl (ref 78.0–100.0)
Monocytes Absolute: 0.3 10*3/uL (ref 0.1–1.0)
Neutro Abs: 2.4 10*3/uL (ref 1.4–7.7)
RBC: 4.66 Mil/uL (ref 3.87–5.11)
WBC: 4.7 10*3/uL (ref 4.5–10.5)

## 2012-11-26 MED ORDER — RANITIDINE HCL 150 MG PO TABS: ORAL_TABLET | ORAL | Status: DC

## 2012-11-26 MED ORDER — LEVOTHYROXINE SODIUM 25 MCG PO TABS: ORAL_TABLET | ORAL | Status: DC

## 2012-11-26 MED ORDER — BENAZEPRIL HCL 5 MG PO TABS: ORAL_TABLET | ORAL | Status: DC

## 2012-11-26 MED ORDER — ASPIRIN 81 MG PO TABS: 81.0000 mg | ORAL_TABLET | Freq: Every day | ORAL | Status: DC

## 2012-11-26 NOTE — Patient Instructions (Addendum)
Share results with all non [facility] medical staff seen.  Please  Schedule NON fasting Labs in 12 weeks after thyroid dose change :  TSH. PLEASE BRING THESE INSTRUCTIONS TO FOLLOW UP  LAB APPOINTMENT.This will guarantee correct labs are drawn, eliminating need for repeat blood sampling ( needle sticks ! ). Diagnoses /Codes: 244.9

## 2012-11-26 NOTE — Progress Notes (Signed)
Subjective:    Patient ID: Kathleen Sellers, female    DOB: 13-Nov-1950, 62 y.o.   MRN: [id number]  HPI  She is here for a physical;acute issues bloody sinus discharge for 4 months.     Review of Systems She's had intermittent bloody, periodic discharge from the right near over the last 4 months as noted; but she's also had intermittent epistaxis from the right nare since she fell and hit her right lateral maxillary area. There was no loss of consciousness with this event but there was dramatic swelling. This was not evaluated. She has intermittent right frontal and right preauricular pain. She will also intermittently cough up sputum which is blood tinged;she relates this to postnasal drainage. She denies facial pain, sore throat, dental pain, otic pain, or otic discharge. She also denies fever, chills, or sweats .   She does not monitor blood pressure at home but it is always within goal at doctor's appointments. She denies chest pain, palpitations, dyspnea, claudication, or edema.  She does have hypersomnolence for past year. She does have sleep apnea and is compliant with the CPAP machine.      Objective:   Physical Exam  Gen.: Healthy and well-nourished in appearance. Alert, appropriate and cooperative throughout exam. Appears younger than stated age  Head: Normocephalic without obvious abnormalities. Eyes: No corneal or conjunctival inflammation noted. Pupils unequal . Extraocular motion intact. Vision grossly normal without lenses Ears: External  ear exam reveals no significant lesions or deformities. Canals clear .L TM scarred. Hearing is grossly normal bilaterally. Nose: External nasal exam reveals no deformity or inflammation. Nasal mucosa are pink and moist. No lesions or exudates noted.   Mouth: Oral mucosa and oropharynx reveal no lesions or exudates. Teeth in good repair. Slight enlargement of the right lateral maxillary ridge to palpation. Neck: No deformities, masses, or  tenderness noted. Range of motion & Thyroid normal. Lungs: Normal respiratory effort; chest expands symmetrically. Lungs are clear to auscultation without rales, wheezes, or increased work of breathing. Heart: Normal rate and rhythm. Normal S1 and S2. No gallop, click, or rub. S4 w/o murmur. Abdomen: Bowel sounds normal; abdomen soft and nontender. No masses, organomegaly or hernias noted. Genitalia:As per Gyn                                  Musculoskeletal/extremities: No deformity or scoliosis noted of  the thoracic or lumbar spine.  No clubbing, cyanosis, edema, or significant extremity  deformity noted. Range of motion normal .Tone & strength  Normal. Joints normal except some hammer toe changes. Nail health good. Able to lie down & sit up w/o help. Negative SLR bilaterally Vascular: Carotid, radial artery, dorsalis pedis and  posterior tibial pulses are full and equal. No bruits present. Neurologic: Alert and oriented x3. Deep tendon reflexes symmetrical and normal.          Skin: Intact without suspicious lesions or rashes. Lymph: No cervical, axillary lymphadenopathy present. Psych: Mood and affect are normal. 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  #3 hypersomnolence.If this persists or progresses; the CPAP equipment should be reevaluated to verify that she is on optimal CPAP settings   Plan: see Orders  & Recommendations

## 2012-11-27 ENCOUNTER — Other Ambulatory Visit: Payer: Commercial Managed Care - PPO

## 2012-12-01 ENCOUNTER — Other Ambulatory Visit: Payer: Commercial Managed Care - PPO

## 2012-12-11 ENCOUNTER — Other Ambulatory Visit: Payer: Self-pay | Admitting: Internal Medicine

## 2012-12-22 ENCOUNTER — Other Ambulatory Visit: Payer: Self-pay | Admitting: Internal Medicine

## 2012-12-22 NOTE — Telephone Encounter (Signed)
Fax Received. Refill Completed. Kathleen Sellers (R.M.A)  NEED APPOINTMENT 

## 2013-01-05 ENCOUNTER — Other Ambulatory Visit: Payer: Self-pay | Admitting: *Deleted

## 2013-01-05 MED ORDER — METOPROLOL SUCCINATE ER 25 MG PO TB24: 25.0000 mg | ORAL_TABLET | Freq: Every day | ORAL | Status: DC

## 2013-01-05 NOTE — Telephone Encounter (Signed)
Rx refilled for metoprolol.   Ag cma

## 2013-01-21 ENCOUNTER — Telehealth: Payer: Self-pay | Admitting: *Deleted

## 2013-01-21 NOTE — Telephone Encounter (Signed)
Triage line message: Patient called and stated that she cannot afford the CT scan that was scheduled in July. She states that she is experiencing sinus issues and would like for an antibiotic along with diflucan called in to her pharmacy at [facility] on [address] if possible.

## 2013-01-22 NOTE — Telephone Encounter (Signed)
I addressed this yesterday. Recommendations were to her followup as she is referring to sinus symptoms for which she was seen in July. I understand her concerns about the expense of the CT; but calling in antibiotics is not appropriate without a followup. Again I see no documentation of this note. HELP !!! Have the notes accompanied the infamous "lost sock" ?

## 2013-01-23 NOTE — Telephone Encounter (Signed)
Called pt at work, she was away from her desk. Left message for pt to return our call to schedule appt to be seen for "sinus issues" as abx can not be called in without MD evaluation.

## 2013-03-30 ENCOUNTER — Other Ambulatory Visit: Payer: Self-pay | Admitting: Internal Medicine

## 2013-06-24 ENCOUNTER — Other Ambulatory Visit: Payer: Self-pay | Admitting: Internal Medicine

## 2013-06-24 NOTE — Telephone Encounter (Signed)
Rx sent to the pharmacy by e-script.//AB/CMA 

## 2013-07-09 ENCOUNTER — Other Ambulatory Visit: Payer: Self-pay | Admitting: Internal Medicine

## 2013-09-16 ENCOUNTER — Encounter: Payer: Self-pay | Admitting: Internal Medicine

## 2013-09-16 ENCOUNTER — Telehealth: Payer: Self-pay | Admitting: *Deleted

## 2013-09-16 ENCOUNTER — Ambulatory Visit (INDEPENDENT_AMBULATORY_CARE_PROVIDER_SITE_OTHER): Payer: PRIVATE HEALTH INSURANCE | Admitting: Internal Medicine

## 2013-09-16 VITALS — BP 108/76 | HR 67 | Temp 98.2°F | Wt 173.0 lb

## 2013-09-16 DIAGNOSIS — J019 Acute sinusitis, unspecified: Secondary | ICD-10-CM

## 2013-09-16 MED ORDER — FLUCONAZOLE 150 MG PO TABS: 150.0000 mg | ORAL_TABLET | Freq: Every day | ORAL | Status: DC

## 2013-09-16 MED ORDER — CLARITHROMYCIN ER 500 MG PO TB24: 1000.0000 mg | ORAL_TABLET | Freq: Every day | ORAL | Status: DC

## 2013-09-16 NOTE — Patient Instructions (Signed)

## 2013-09-16 NOTE — Telephone Encounter (Signed)
Left msg on triage stating the Biacin XL that md sent in they are currently out of. Wanting to know can it be change to regular Biacin or rx something else...Raechel Chute/lmb

## 2013-09-16 NOTE — Progress Notes (Signed)
   Subjective:    Patient ID: Kathleen Sellers, female    DOB: 06/12/1950, 63 y.o.   MRN: [id number]  HPI  Symptoms began 09/11/13 as sore throat, rhinitis with thick nasal discharge. This was associated with head congestion. Subsequently she developed purulent sputum production. Her symptoms now consist of right frontal sinus pain, right maxillary sinus pain, dental pain, and nasal purulence. She also has pain in the right ear. She describes halitosis as well.  Mucinex was taken 5/16-5/19. She's also taking nonsteroidals. There is some temporary response of the pain which can be as bad as a level VIII. With the medications it can drop to a level II.  Review of Systems  She denies sore throat, otic discharge, wheezing, shortness of breath, fever, chills, or sweats  She has no extrinsic symptoms of itchy, watery eyes, sneezing.        Objective:   Physical Exam General appearance:good health ;well nourished; no acute distress or increased work of breathing is present.  No  lymphadenopathy about the head, neck, or axilla noted.   Eyes: No conjunctival inflammation or lid edema is present. There is no scleral icterus. Off center OS pupil  Ears:  External ear exam shows no significant lesions or deformities.  Otoscopic examination reveals clear canals, tympanic membranes are intact bilaterally without bulging, retraction, inflammation or discharge.  Nose:  External nasal examination shows no deformity or inflammation. Nasal mucosa are dry without lesions or exudates. No septal dislocation or deviation.No obstruction to airflow.   Oral exam: Dental hygiene is good; lips and gums are healthy appearing.There is no oropharyngeal erythema or exudate noted.   Neck:  No deformities, thyromegaly, masses, or tenderness noted.   Supple with full range of motion without pain.   Heart:  Normal rate and regular rhythm. S1 and S2 normal without gallop, murmur, click, rub or other extra sounds.    Lungs:Chest clear to auscultation; no wheezes, rhonchi,rales ,or rubs present.No increased work of breathing.    Extremities:  No cyanosis, edema, or clubbing  noted    Skin: Warm & dry          Assessment & Plan:  #1 rhinosinusitis without significant bronchitis  Plan: Nasal hygiene interventions discussed. See prescription medications

## 2013-09-16 NOTE — Telephone Encounter (Signed)
Allergic to Ceclor as well as sulfa. Penicillin drugs  contraindicated.  See if  this is available at another pharmacy.

## 2013-09-16 NOTE — Progress Notes (Signed)
Pre visit review using our clinic review tool, if applicable. No additional management support is needed unless otherwise documented below in the visit note. 

## 2013-09-17 NOTE — Telephone Encounter (Signed)
They do have the regular and were already filling for this patient.

## 2013-10-07 ENCOUNTER — Other Ambulatory Visit: Payer: Self-pay | Admitting: Internal Medicine

## 2013-11-12 ENCOUNTER — Other Ambulatory Visit: Payer: Self-pay | Admitting: Internal Medicine

## 2013-12-18 ENCOUNTER — Other Ambulatory Visit: Payer: Self-pay | Admitting: Internal Medicine

## 2014-01-17 ENCOUNTER — Other Ambulatory Visit: Payer: Self-pay | Admitting: Internal Medicine

## 2014-01-18 NOTE — Telephone Encounter (Signed)
Patient has not been seen since 2013 and has had multiple warnings to call and schedule an appointment. To date, patient has failed to do so. Ok to refill again? Please advise. Thanks, MI

## 2014-01-18 NOTE — Telephone Encounter (Signed)
I cannot fill. I saw the patient in 2013

## 2014-01-19 ENCOUNTER — Other Ambulatory Visit: Payer: Self-pay

## 2014-01-19 MED ORDER — FUROSEMIDE 40 MG PO TABS: ORAL_TABLET | ORAL | Status: DC

## 2014-01-22 ENCOUNTER — Other Ambulatory Visit: Payer: Self-pay | Admitting: Internal Medicine

## 2014-01-22 ENCOUNTER — Other Ambulatory Visit: Payer: Self-pay

## 2014-01-22 NOTE — Telephone Encounter (Signed)
#   30, R X 2 

## 2014-01-22 NOTE — Telephone Encounter (Signed)
Can't tell if you prescribed this for the patient

## 2014-01-25 MED ORDER — FUROSEMIDE 40 MG PO TABS: ORAL_TABLET | ORAL | Status: DC

## 2014-01-26 DIAGNOSIS — Z0279 Encounter for issue of other medical certificate: Secondary | ICD-10-CM

## 2014-01-27 ENCOUNTER — Telehealth: Payer: Self-pay | Admitting: Internal Medicine

## 2014-01-27 NOTE — Telephone Encounter (Signed)
Have you received wellness forms for this patient and her husband Aneta Minshillip (01/12/50)?

## 2014-01-28 NOTE — Telephone Encounter (Signed)
I complete today; I just got them yesterday

## 2014-01-28 NOTE — Telephone Encounter (Signed)
Dr. Alwyn RenHopper do you have these forms?

## 2014-01-28 NOTE — Telephone Encounter (Signed)
Patient called back in today.  She states that her deadline was yesterday.

## 2014-01-28 NOTE — Telephone Encounter (Signed)
Thanks

## 2014-02-05 ENCOUNTER — Other Ambulatory Visit: Payer: Self-pay

## 2014-02-05 MED ORDER — FUROSEMIDE 40 MG PO TABS: ORAL_TABLET | ORAL | Status: DC

## 2014-02-05 MED ORDER — METOPROLOL SUCCINATE ER 25 MG PO TB24: ORAL_TABLET | ORAL | Status: DC

## 2014-03-10 ENCOUNTER — Other Ambulatory Visit: Payer: Self-pay | Admitting: Internal Medicine

## 2014-03-15 ENCOUNTER — Ambulatory Visit: Payer: PRIVATE HEALTH INSURANCE | Admitting: Internal Medicine

## 2014-03-19 ENCOUNTER — Encounter: Payer: Self-pay | Admitting: Internal Medicine

## 2014-03-19 ENCOUNTER — Ambulatory Visit (INDEPENDENT_AMBULATORY_CARE_PROVIDER_SITE_OTHER): Payer: PRIVATE HEALTH INSURANCE | Admitting: Internal Medicine

## 2014-03-19 VITALS — BP 120/76 | HR 50 | Ht 63.05 in | Wt 175.8 lb

## 2014-03-19 DIAGNOSIS — R002 Palpitations: Secondary | ICD-10-CM

## 2014-03-19 DIAGNOSIS — E782 Mixed hyperlipidemia: Secondary | ICD-10-CM

## 2014-03-19 DIAGNOSIS — I10 Essential (primary) hypertension: Secondary | ICD-10-CM

## 2014-03-19 NOTE — Patient Instructions (Signed)
Your physician has recommended you make the following change in your medication:  1.) stop metoprolol Your physician recommends that you return for lab work. (BMET, CBC, TSH, LIPIDS) Your physician wants you to follow-up in: 1 YEAR WITH DR ROSS.  You will receive a reminder letter in the mail two months in advance. If you don't receive a letter, please call our office to schedule the follow-up appointment.

## 2014-03-19 NOTE — Progress Notes (Signed)
HPIMs. Kathleen Sellers is a 63 year old woman with a history of CAD(cath 2003: 50 to 60% LAD; 60% OM2; 20% RCA), dyslipidemia, hypothyroidism, hypertension, . I saw her back in 2013   SInce seen  She has done well  No CP  No SOB  NO dizziness Not in organized physical activity  Is losing wt.  Prob started  Feels better      Allergies  Allergen Reactions  . Sulfamethoxazole-Trimethoprim     Rash   . Sulfonamide Derivatives     rash  . Cefaclor     Itching w/o rash    Current Outpatient Prescriptions  Medication Sig Dispense Refill  . ASPIRIN LOW DOSE 81 MG EC tablet TAKE 1 TABLET (81 MG TOTAL) BY MOUTH DAILY. 90 tablet 0  . benazepril (LOTENSIN) 5 MG tablet 1 by mouth daily 30 tablet 11  . escitalopram (LEXAPRO) 20 MG tablet Take 20 mg by mouth daily.      . fluconazole (DIFLUCAN) 150 MG tablet Take 1 tablet (150 mg total) by mouth daily. 1 tablet 0  . furosemide (LASIX) 40 MG tablet TAKE 1 TABLET (40 MG TOTAL) BY MOUTH DAILY 30 tablet 2  . levothyroxine (SYNTHROID, LEVOTHROID) 25 MCG tablet TAKE 1 TABLET (25 MCG TOTAL) BY MOUTH DAILY EXCEPT 1&1/2 M,W, F. 108 tablet 2  . LORazepam (ATIVAN) 2 MG tablet Take 2 mg by mouth. 1/2 by mouth twice daily Rx'ed by Dr.Baker    . metoprolol succinate (TOPROL-XL) 25 MG 24 hr tablet TAKE 1 TABLET (25 MG TOTAL) BY MOUTH DAILY. 90 tablet 1  . ranitidine (ZANTAC) 150 MG tablet TAKE 1 TABLET BY MOUTH TWICE DAILY 180 tablet 2   No current facility-administered medications for this visit.    Past Medical History  Diagnosis Date  . Hyperglycemia   . CAD (coronary artery disease)   . Hypertension   . Hypothyroidism   . Depression   . Colon polyps 2005  . Hyperlipidemia   . Cerebral atrophy 02/07/2011    done to assess head trauma ( [facility])    Past Surgical History  Procedure Laterality Date  . Cardiac catheterization  2003  . Oophorectomy  1970    benign tumor  . Appendectomy  1970    with USO  . Cataract extraction      Dr  Nile RiggsShapiro  . Cholecystectomy  2005  . Colonoscopy w/ polypectomy  2006    Dr. Evette CristalGanem  . Hysterectomy & uso  1995  . Nasal sinus surgery  1988    Dr. Haroldine Lawsrossley    Family History  Problem Relation Age of Onset  . Stroke Father     cerebral hemorrhage  . Diabetes Mother   . Hypertension Mother   . Lung cancer Mother     with bone metastases  . Coronary artery disease Paternal Uncle   . Breast cancer Maternal Grandmother   . Breast cancer Maternal Grandmother   . Heart attack Paternal Grandfather     in 6050s    History   Social History  . Marital Status: Married    Spouse Name: N/A    Number of Children: 3  . 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 Smoking status: Never Smoker   . Smokeless tobacco: Not on file  . Alcohol Use: No  . Drug Use: No  . Sexual Activity: Not on file   Other Topics Concern  .  Not on file   Social History Narrative   No reg exercise    Review of Systems:  All systems reviewed.  They are negative to the above problem except as previously stated.  Vital Signs: BP 120/76 mmHg  Pulse 50  Ht 5' 3.05" (1.601 m)  Wt 175 lb 12.8 oz (79.742 kg)  BMI 31.11 kg/m2  Physical Exam  HEENT:  Normocephalic, atraumatic. EOMI, PERRLA.  Neck: JVP is normal.  No bruits.  Lungs: clear to auscultation. No rales no wheezes.  Heart: Regular rate and rhythm. Normal S1, S2. No S3.   No significant murmurs. PMI not displaced.  Abdomen:  Supple, nontender. Normal bowel sounds. No masses. No hepatomegaly.  Extremities:   Good distal pulses throughout. No lower extremity edema.  Musculoskeletal :moving all extremities.  Neuro:   alert and oriented x3.  CN II-XII grossly intact.  EKG:  SB  50 bpm.  T wave inversion III, V1-V3  (unchanged)  Assessment and Plan:  1.  CAD.  No symptoms of angina.  Follow  Check labs 2.  HTN  GOod control  Could stop metoprolol and follow   3.  HL  WIll  need to  Get lipids.  Patinet is not on statin    patinet is under increased stress  Husband with altzheimer's  Caring for him  Financial issues  [facility] Wants to eliminate any unnecessary meds for cost   I wrote out all her meds and why on  What is necessary  She needs to be on a statin Stop metoprolol  Follow bvp Without lasix she gets edema  She says she watches salt.

## 2014-03-20 ENCOUNTER — Other Ambulatory Visit: Payer: Self-pay | Admitting: Internal Medicine

## 2014-04-05 ENCOUNTER — Other Ambulatory Visit: Payer: Self-pay | Admitting: *Deleted

## 2014-04-05 NOTE — Telephone Encounter (Signed)
-----   Message from Dietrich PatesPaula Ross V, MD sent at 03/22/2014 10:56 PM EST ----- LDL is too high  Needs to be on a statin Would recomm lipitor 40  F/U lipids and AST in 8 wks.

## 2014-04-06 ENCOUNTER — Other Ambulatory Visit: Payer: Self-pay

## 2014-04-06 DIAGNOSIS — Z79899 Other long term (current) drug therapy: Secondary | ICD-10-CM

## 2014-04-06 DIAGNOSIS — E785 Hyperlipidemia, unspecified: Secondary | ICD-10-CM

## 2014-04-06 MED ORDER — ATORVASTATIN CALCIUM 40 MG PO TABS: 40.0000 mg | ORAL_TABLET | Freq: Every day | ORAL | Status: DC

## 2014-05-07 ENCOUNTER — Other Ambulatory Visit: Payer: Self-pay | Admitting: Internal Medicine

## 2014-06-02 ENCOUNTER — Other Ambulatory Visit: Payer: Self-pay

## 2014-06-19 ENCOUNTER — Other Ambulatory Visit: Payer: Self-pay | Admitting: Internal Medicine

## 2014-09-25 ENCOUNTER — Other Ambulatory Visit: Payer: Self-pay | Admitting: Internal Medicine

## 2015-04-07 ENCOUNTER — Other Ambulatory Visit: Payer: Self-pay | Admitting: Internal Medicine

## 2015-05-07 ENCOUNTER — Other Ambulatory Visit: Payer: Self-pay | Admitting: Internal Medicine

## 2015-05-21 ENCOUNTER — Other Ambulatory Visit: Payer: Self-pay | Admitting: Internal Medicine

## 2015-05-24 ENCOUNTER — Other Ambulatory Visit: Payer: Self-pay

## 2015-05-24 NOTE — Telephone Encounter (Signed)
Called and spoke with patient and she stated she gets her refills (lipitor included) from her Primary care physician.

## 2015-06-07 ENCOUNTER — Telehealth: Payer: Self-pay | Admitting: Internal Medicine

## 2015-06-07 NOTE — Telephone Encounter (Signed)
Called pt and left message for pt to give our office a call concerning her medication refill. Pt has not been seen in our office to 03/19/14 and that pt needed to make an appointment or get her PCP to refill her medications. Waiting for pt to return call.

## 2015-11-21 ENCOUNTER — Telehealth: Payer: Self-pay | Admitting: *Deleted

## 2015-11-21 NOTE — Telephone Encounter (Signed)
Left smg on triage stating her doctor who rx her lexapro & ativan no longer except her insurance. Wanting to know does Dr. Lawerance Bach rx these two medications...Kathleen Sellers

## 2015-11-23 NOTE — Telephone Encounter (Signed)
Tried calling pt back she hasn't seen Dr. Alwyn Ren since 2015 she need to schedule new appt w/Dr. Lawerance Bach if needing refills...Raechel Chute

## 2016-11-13 ENCOUNTER — Other Ambulatory Visit: Payer: Self-pay | Admitting: Gastroenterology

## 2016-11-13 DIAGNOSIS — R1033 Periumbilical pain: Secondary | ICD-10-CM

## 2016-11-13 NOTE — Progress Notes (Signed)
Kathleen Tayler MD 

## 2016-11-14 ENCOUNTER — Ambulatory Visit
Admission: RE | Admit: 2016-11-14 | Discharge: 2016-11-14 | Disposition: A | Discharge status: Home / Self Care | Payer: Managed Care, Other (non HMO) | Source: Ambulatory Visit | Attending: Gastroenterology | Admitting: Gastroenterology

## 2016-11-14 DIAGNOSIS — Z0279 Encounter for issue of other medical certificate: Secondary | ICD-10-CM

## 2016-11-14 DIAGNOSIS — R1033 Periumbilical pain: Secondary | ICD-10-CM

## 2016-11-14 MED ORDER — IOPAMIDOL (ISOVUE-300) INJECTION 61%
100.0000 mL | Freq: Once | INTRAVENOUS | Status: AC | PRN
  Administered 2016-11-14: 100 mL via INTRAVENOUS

## 2018-02-20 ENCOUNTER — Other Ambulatory Visit: Payer: Self-pay | Admitting: Internal Medicine

## 2018-02-20 DIAGNOSIS — Z1231 Encounter for screening mammogram for malignant neoplasm of breast: Secondary | ICD-10-CM

## 2018-03-13 ENCOUNTER — Other Ambulatory Visit: Payer: Self-pay | Admitting: Physician Assistant

## 2018-03-13 DIAGNOSIS — Z13820 Encounter for screening for osteoporosis: Secondary | ICD-10-CM

## 2018-03-17 ENCOUNTER — Other Ambulatory Visit: Payer: Self-pay | Admitting: Physician Assistant

## 2018-03-17 DIAGNOSIS — Z1231 Encounter for screening mammogram for malignant neoplasm of breast: Secondary | ICD-10-CM

## 2018-03-18 ENCOUNTER — Other Ambulatory Visit: Payer: Self-pay | Admitting: Physician Assistant

## 2018-03-18 ENCOUNTER — Ambulatory Visit
Admission: RE | Admit: 2018-03-18 | Discharge: 2018-03-18 | Disposition: A | Discharge status: Home / Self Care | Payer: Managed Care, Other (non HMO) | Source: Ambulatory Visit | Attending: Internal Medicine | Admitting: Internal Medicine

## 2018-03-18 DIAGNOSIS — Z1231 Encounter for screening mammogram for malignant neoplasm of breast: Secondary | ICD-10-CM

## 2019-01-15 ENCOUNTER — Other Ambulatory Visit: Payer: Self-pay | Admitting: Internal Medicine

## 2019-01-15 DIAGNOSIS — Z1231 Encounter for screening mammogram for malignant neoplasm of breast: Secondary | ICD-10-CM

## 2019-03-20 ENCOUNTER — Ambulatory Visit: Payer: Managed Care, Other (non HMO)

## 2021-06-13 ENCOUNTER — Emergency Department (HOSPITAL_COMMUNITY): Payer: PRIVATE HEALTH INSURANCE

## 2021-06-13 ENCOUNTER — Emergency Department (HOSPITAL_COMMUNITY)
Admission: EM | Admit: 2021-06-13 | Discharge: 2021-06-13 | Disposition: A | Discharge status: Home / Self Care | Payer: PRIVATE HEALTH INSURANCE | Attending: Emergency Medicine | Admitting: Emergency Medicine

## 2021-06-13 ENCOUNTER — Other Ambulatory Visit: Payer: Self-pay

## 2021-06-13 DIAGNOSIS — Z7901 Long term (current) use of anticoagulants: Secondary | ICD-10-CM | POA: Diagnosis not present

## 2021-06-13 DIAGNOSIS — Z7982 Long term (current) use of aspirin: Secondary | ICD-10-CM | POA: Insufficient documentation

## 2021-06-13 DIAGNOSIS — I10 Essential (primary) hypertension: Secondary | ICD-10-CM | POA: Insufficient documentation

## 2021-06-13 DIAGNOSIS — I4891 Unspecified atrial fibrillation: Secondary | ICD-10-CM | POA: Insufficient documentation

## 2021-06-13 DIAGNOSIS — Z79899 Other long term (current) drug therapy: Secondary | ICD-10-CM | POA: Insufficient documentation

## 2021-06-13 LAB — BASIC METABOLIC PANEL
Anion gap: 8 (ref 5–15)
BUN: 26 mg/dL — ABNORMAL HIGH (ref 8–23)
CO2: 21 mmol/L — ABNORMAL LOW (ref 22–32)
Calcium: 9 mg/dL (ref 8.9–10.3)
Chloride: 108 mmol/L (ref 98–111)
Creatinine, Ser: 1.07 mg/dL — ABNORMAL HIGH (ref 0.44–1.00)
GFR, Estimated: 56 mL/min — ABNORMAL LOW (ref >60)
Glucose, Bld: 104 mg/dL — ABNORMAL HIGH (ref 70–99)
Potassium: 5.1 mmol/L (ref 3.5–5.1)
Sodium: 137 mmol/L (ref 135–145)

## 2021-06-13 LAB — TROPONIN I (HIGH SENSITIVITY)
Troponin I (High Sensitivity): 6 ng/L (ref <18)
Troponin I (High Sensitivity): 9 ng/L (ref <18)

## 2021-06-13 LAB — CBC
HCT: 43.4 % (ref 36.0–46.0)
Hemoglobin: 14 g/dL (ref 12.0–15.0)
MCH: 28.5 pg (ref 26.0–34.0)
MCHC: 32.3 g/dL (ref 30.0–36.0)
MCV: 88.4 fL (ref 80.0–100.0)
Platelets: 261 10*3/uL (ref 150–400)
RBC: 4.91 MIL/uL (ref 3.87–5.11)
RDW: 14 % (ref 11.5–15.5)
WBC: 6.6 10*3/uL (ref 4.0–10.5)
nRBC: 0 % (ref 0.0–0.2)

## 2021-06-13 LAB — MAGNESIUM: Magnesium: 2.1 mg/dL (ref 1.7–2.4)

## 2021-06-13 LAB — BRAIN NATRIURETIC PEPTIDE: B Natriuretic Peptide: 330.4 pg/mL — ABNORMAL HIGH (ref 0.0–100.0)

## 2021-06-13 MED ORDER — DILTIAZEM HCL-DEXTROSE 125-5 MG/125ML-% IV SOLN (PREMIX)
5.0000 mg/h | INTRAVENOUS | Status: DC
  Administered 2021-06-13: 5 mg/h via INTRAVENOUS
  Filled 2021-06-13: qty 125

## 2021-06-13 MED ORDER — RIVAROXABAN 10 MG PO TABS
20.0000 mg | ORAL_TABLET | Freq: Every day | ORAL | Status: DC
  Administered 2021-06-13: 20 mg via ORAL
  Filled 2021-06-13: qty 2

## 2021-06-13 MED ORDER — DILTIAZEM LOAD VIA INFUSION
10.0000 mg | Freq: Once | INTRAVENOUS | Status: AC
  Administered 2021-06-13: 10 mg via INTRAVENOUS
  Filled 2021-06-13: qty 10

## 2021-06-13 MED ORDER — SODIUM CHLORIDE 0.9 % IV BOLUS
1000.0000 mL | Freq: Once | INTRAVENOUS | Status: AC
  Administered 2021-06-13: 1000 mL via INTRAVENOUS

## 2021-06-13 MED ORDER — RIVAROXABAN 20 MG PO TABS: 20.0000 mg | ORAL_TABLET | Freq: Every day | ORAL | 0 refills | Status: AC

## 2021-06-13 NOTE — ED Provider Triage Note (Signed)
Emergency Medicine Provider Triage Evaluation Note  DONNABELLE RUPLINGER , a 71 y.o. female  was evaluated in triage.  Pt complains of having some shoulder, neck pain, with chest pressure starting last night, felt off this morning so went to urgent care was found to be in A-fib.  She received 500 cc fluid bolus in route by EMS, converted out of A-fib at that time.  She has never had A-fib in the past, she is not taking any blood thinners.  Patient reports that her back pain is slightly improved at this time.  Review of Systems  Positive: Chest pressure, shortness of breath, neck pain, back pain Negative: Nausea, vomiting, diaphoresis  Physical Exam  BP (!) 125/113 (BP Location: Left Arm)    Pulse (!) 51    Temp 98.4 F (36.9 C) (Oral)    Resp 18    SpO2 96%  Gen:   Awake, no distress   Resp:  Normal effort, on [place] MSK:   Moves extremities without difficulty  Other:  On my exam patient is back in A-fib with an irregularly irregular rhythm, some runs of tachycardia, she is not in any acute distress  Medical Decision Making  Medically screening exam initiated at 11:07 AM.  Appropriate orders placed.  ERIKO OBRION was informed that the remainder of the evaluation will be completed by another provider, this initial triage assessment does not replace that evaluation, and the importance of remaining in the ED until their evaluation is complete.  Workup initiated   Anselmo Pickler, [place] 06/13/21 1108

## 2021-06-13 NOTE — ED Notes (Signed)
Pt converted to NSR, EKG obtained and Dr Stevie Kern updated

## 2021-06-13 NOTE — ED Notes (Signed)
Pt verbalized understanding of d/c instructions, meds and followup care. Denies questions. VSS, no distress noted. Steady gait to exit with all belongings.  ?

## 2021-06-13 NOTE — ED Triage Notes (Signed)
Pt. Stated, This morning I was having pain between my shoulder pain and my shoulders were hurting. And I had a little chest tightness.

## 2021-06-13 NOTE — ED Provider Notes (Signed)
[facility] EMERGENCY DEPARTMENT Provider Note   CSN: [id number] Arrival date & time: 06/13/21  1047     History  Chief Complaint  Patient presents with   Atrial Fibrillation    Kathleen Sellers is a 71 y.o. female.  Presented to the emergency room with concern for atrial fibrillation.  Patient reports that she went to the urgent care due to back discomfort.  Reports that she has been having this pain, relatively mild initially over the past few months and that seem to be getting worse over the past day or so.  Today she also associated some chest discomfort/tightness sensation.  Felt generally unwell, fatigued today as well as some lightheadedness.  States that her symptoms now seem to be improved and she currently does not have any chest discomfort or back pain.  She went to an urgent care initially and she was found to be in new onset atrial fibrillation and sent to ER for further evaluation.  Patient denies prior history of atrial fibrillation.  Takes baby aspirin, not currently on any anticoagulation.  She denies prior brain bleed, GI bleed.  No recent surgeries.  HPI     Home Medications Prior to Admission medications   Medication Sig Start Date End Date Taking? Authorizing Provider  rivaroxaban (XARELTO) 20 MG TABS tablet Take 1 tablet (20 mg total) by mouth daily with supper. 06/13/21  Yes Lucrezia Starch, MD  aspirin (ASPIRIN LOW DOSE) 81 MG EC tablet Take 1 tablet (81 mg total) by mouth daily. --- Must have office visit for further refills. 04/07/15   Hendricks Limes, MD  atorvastatin (LIPITOR) 40 MG tablet TAKE 1 TABLET (40 MG TOTAL) BY MOUTH DAILY.   -- NEED LAB WORK AND OFFICE VISIT FOR REFILLS 05/09/15   Fay Records, MD  benazepril (LOTENSIN) 5 MG tablet 1 by mouth daily 12/11/12   Hendricks Limes, MD  escitalopram (LEXAPRO) 20 MG tablet Take 20 mg by mouth daily.      [provider]  furosemide (LASIX) 40 MG tablet TAKE 1 TABLET (40 MG  TOTAL) BY MOUTH DAILY 02/05/14   Fay Records, MD  levothyroxine (SYNTHROID, LEVOTHROID) 25 MCG tablet TAKE 1 TABLET (25 MCG TOTAL) BY MOUTH DAILY EXCEPT 1&1/2 M,W, F.    Hendricks Limes, MD  LORazepam (ATIVAN) 2 MG tablet Take 2 mg by mouth. 1/2 by mouth twice daily Rx'ed by Dr.Baker    [provider]  ranitidine (ZANTAC) 150 MG tablet TAKE 1 TABLET BY MOUTH TWICE DAILY 11/12/13   Hendricks Limes, MD      Allergies    Sulfamethoxazole-trimethoprim, Sulfonamide derivatives, and Cefaclor    Review of Systems   Review of Systems  Constitutional:  Negative for chills and fever.  HENT:  Negative for ear pain and sore throat.   Eyes:  Negative for pain and visual disturbance.  Respiratory:  Negative for cough and shortness of breath.   Cardiovascular:  Positive for chest pain. Negative for palpitations.  Gastrointestinal:  Negative for abdominal pain and vomiting.  Genitourinary:  Negative for dysuria and hematuria.  Musculoskeletal:  Positive for back pain. Negative for arthralgias.  Skin:  Negative for color change and rash.  Neurological:  Negative for seizures and syncope.  All other systems reviewed and are negative.  Physical Exam Updated Vital Signs BP 112/74    Pulse 66    Temp 98.4 F (36.9 C) (Oral)    Resp 17  SpO2 98%  Physical Exam Vitals and nursing note reviewed.  Constitutional:      General: She is not in acute distress.    Appearance: She is well-developed.  HENT:     Head: Normocephalic and atraumatic.  Eyes:     Conjunctiva/sclera: Conjunctivae normal.  Cardiovascular:     Rate and Rhythm: Tachycardia present. Rhythm irregular.     Pulses: Normal pulses.     Heart sounds: No murmur heard. Pulmonary:     Effort: Pulmonary effort is normal. No respiratory distress.     Breath sounds: Normal breath sounds.  Abdominal:     Palpations: Abdomen is soft.     Tenderness: There is no abdominal tenderness.  Musculoskeletal:        General: No  swelling.     Cervical back: Neck supple.  Skin:    General: Skin is warm and dry.     Capillary Refill: Capillary refill takes less than 2 seconds.  Neurological:     General: No focal deficit present.     Mental Status: She is alert and oriented to person, place, and time.  Psychiatric:        Mood and Affect: Mood normal.    ED Results / Procedures / Treatments   Labs (all labs ordered are listed, but only abnormal results are displayed) Labs Reviewed  BASIC METABOLIC PANEL - Abnormal; Notable for the following components:      Result Value   CO2 21 (*)    Glucose, Bld 104 (*)    BUN 26 (*)    Creatinine, Ser 1.07 (*)    GFR, Estimated 56 (*)    All other components within normal limits  BRAIN NATRIURETIC PEPTIDE - Abnormal; Notable for the following components:   B Natriuretic Peptide 330.4 (*)    All other components within normal limits  CBC  MAGNESIUM  TROPONIN I (HIGH SENSITIVITY)  TROPONIN I (HIGH SENSITIVITY)    EKG EKG Interpretation  Date/Time:  Tuesday June 13 2021 12:12:17 EST Ventricular Rate:  149 PR Interval:    QRS Duration: 91 QT Interval:  309 QTC Calculation: 487 R Axis:   -49 Text Interpretation: Atrial fibrillation Left anterior fascicular block Abnormal R-wave progression, late transition Probable left ventricular hypertrophy Borderline prolonged QT interval Confirmed by Madalyn Rob [phone]) on 06/13/2021 12:27:24 PM  Radiology DG Chest 2 View  Result Date: 06/13/2021 CLINICAL DATA:  Chest pain EXAM: CHEST - 2 VIEW COMPARISON:  06/28/2010 FINDINGS: At least 1 upper lumbar compression deformity, suboptimally evaluated. Mild right hemidiaphragm elevation. Midline trachea. Normal heart size. Atherosclerosis in the transverse aorta. No pleural effusion or pneumothorax. Clear lungs. IMPRESSION: No acute cardiopulmonary disease. At least 1 upper lumbar compression deformity, new since 2012. Aortic Atherosclerosis (ICD10-I70.0). Electronically  Signed   By: Abigail Miyamoto M.D.   On: 06/13/2021 11:25    Procedures .Critical Care Performed by: Lucrezia Starch, MD Authorized by: Lucrezia Starch, MD   Critical care provider statement:    Critical care time (minutes):  37   Critical care was time spent personally by me on the following activities:  Development of treatment plan with patient or surrogate, discussions with consultants, evaluation of patient's response to treatment, examination of patient, ordering and review of laboratory studies, ordering and review of radiographic studies, ordering and performing treatments and interventions, pulse oximetry, re-evaluation of patient's condition and review of old charts    Medications Ordered in ED Medications  diltiazem (CARDIZEM) 1 mg/mL load via  infusion 10 mg (10 mg Intravenous Bolus from Bag 06/13/21 1219)    And  diltiazem (CARDIZEM) 125 mg in dextrose 5% 125 mL (1 mg/mL) infusion (0 mg/hr Intravenous Stopped 06/13/21 1353)  rivaroxaban (XARELTO) tablet 20 mg (20 mg Oral Given 06/13/21 1453)  sodium chloride 0.9 % bolus 1,000 mL (0 mLs Intravenous Stopped 06/13/21 1310)    ED Course/ Medical Decision Making/ A&P       CHA2DS2-VASc Score: 3                    Medical Decision Making Amount and/or Complexity of Data Reviewed Labs: ordered.  Risk Prescription drug management.   71 year old lady presented to the emergency room with concern for new onset atrial fibrillation.  She had gone to urgent care due to concern for upper back pain and slight chest pain and feeling generally unwell.  On arrival to ER, patient noted to be in atrial fibrillation with RVR with rates up to 150s.  BP soft but stable.  She denied ongoing symptoms on arrival here.  Check basic laboratory work, no electrolyte derangement or AKI.  Initial troponin within normal limits.  Have lower suspicion for ACS.  Suspect A-fib as etiology for at least some of her symptoms.  Given there is no clear onset of her  A-fib, do not feel patient was cardioversion candidate in ER today.  Started diltiazem.  Provided some fluids.  Patient converted to sinus rhythm a little while after starting the diltiazem.  Repeat EKG confirms sinus.  She continues to deny any ongoing complaint.  We will check a second troponin.  No contraindications to anticoagulation.  Elevated CHA2DS2-VASc score (3) given her age, female, history of hypertension/hyperlipidemia.  Discussed pros and cons of anticoagulation patient consented.  Will give initial dose of Xarelto here and send Rx for Xarelto.  Advised patient follow-up with her regular cardiologist or to follow-up with the [facility] cardiology atrial fibrillation clinic.        Final Clinical Impression(s) / ED Diagnoses Final diagnoses:  Atrial fibrillation with RVR (HCC)  Atrial fibrillation, unspecified type ([place])    Rx / DC Orders ED Discharge Orders          Ordered    rivaroxaban (XARELTO) 20 MG TABS tablet  Daily with supper        06/13/21 1423    Amb Referral to AFIB Clinic       Comments: New onset afib, resolved in ER, now in sinus   06/13/21 1423              Lucrezia Starch, MD 06/13/21 1605

## 2021-06-13 NOTE — ED Triage Notes (Signed)
EMS stated, I picked her up from UC with new onset of A-Fib 22g lt AC , 500 cc IV

## 2021-06-13 NOTE — Discharge Instructions (Addendum)
Please follow-up with your cardiologist or our cardiology group.  Recommend starting the blood thinner.  Additionally recommend follow-up with your primary care doctor.  If you develop any chest pain, worsening pain, palpitations, difficulty breathing or other new concerning symptom, please return to the emergency room for reassessment.

## 2021-06-17 ENCOUNTER — Emergency Department (HOSPITAL_COMMUNITY): Payer: PRIVATE HEALTH INSURANCE

## 2021-06-17 ENCOUNTER — Other Ambulatory Visit: Payer: Self-pay

## 2021-06-17 ENCOUNTER — Emergency Department (HOSPITAL_COMMUNITY)
Admission: EM | Admit: 2021-06-17 | Discharge: 2021-06-17 | Disposition: A | Discharge status: Home / Self Care | Payer: PRIVATE HEALTH INSURANCE | Attending: Emergency Medicine | Admitting: Emergency Medicine

## 2021-06-17 ENCOUNTER — Encounter (HOSPITAL_COMMUNITY): Payer: Self-pay | Admitting: Emergency Medicine

## 2021-06-17 DIAGNOSIS — I4891 Unspecified atrial fibrillation: Secondary | ICD-10-CM

## 2021-06-17 DIAGNOSIS — Z7982 Long term (current) use of aspirin: Secondary | ICD-10-CM | POA: Diagnosis not present

## 2021-06-17 DIAGNOSIS — I2510 Atherosclerotic heart disease of native coronary artery without angina pectoris: Secondary | ICD-10-CM | POA: Insufficient documentation

## 2021-06-17 DIAGNOSIS — Z79899 Other long term (current) drug therapy: Secondary | ICD-10-CM | POA: Diagnosis not present

## 2021-06-17 DIAGNOSIS — Z7901 Long term (current) use of anticoagulants: Secondary | ICD-10-CM | POA: Diagnosis not present

## 2021-06-17 DIAGNOSIS — R0602 Shortness of breath: Secondary | ICD-10-CM | POA: Diagnosis present

## 2021-06-17 DIAGNOSIS — I10 Essential (primary) hypertension: Secondary | ICD-10-CM | POA: Insufficient documentation

## 2021-06-17 DIAGNOSIS — E039 Hypothyroidism, unspecified: Secondary | ICD-10-CM | POA: Insufficient documentation

## 2021-06-17 LAB — CBC WITH DIFFERENTIAL/PLATELET
Abs Immature Granulocytes: 0.02 10*3/uL (ref 0.00–0.07)
Basophils Absolute: 0 10*3/uL (ref 0.0–0.1)
Basophils Relative: 0 %
Eosinophils Absolute: 0.2 10*3/uL (ref 0.0–0.5)
Eosinophils Relative: 5 %
HCT: 42.3 % (ref 36.0–46.0)
Hemoglobin: 14.3 g/dL (ref 12.0–15.0)
Immature Granulocytes: 0 %
Lymphocytes Relative: 28 %
Lymphs Abs: 1.2 10*3/uL (ref 0.7–4.0)
MCH: 28.8 pg (ref 26.0–34.0)
MCHC: 33.8 g/dL (ref 30.0–36.0)
MCV: 85.1 fL (ref 80.0–100.0)
Monocytes Absolute: 0.5 10*3/uL (ref 0.1–1.0)
Monocytes Relative: 11 %
Neutro Abs: 2.5 10*3/uL (ref 1.7–7.7)
Neutrophils Relative %: 56 %
Platelets: 196 10*3/uL (ref 150–400)
RBC: 4.97 MIL/uL (ref 3.87–5.11)
RDW: 13.8 % (ref 11.5–15.5)
WBC: 4.5 10*3/uL (ref 4.0–10.5)
nRBC: 0 % (ref 0.0–0.2)

## 2021-06-17 LAB — PROTIME-INR
INR: 1.3 — ABNORMAL HIGH (ref 0.8–1.2)
Prothrombin Time: 16.1 seconds — ABNORMAL HIGH (ref 11.4–15.2)

## 2021-06-17 LAB — BASIC METABOLIC PANEL
Anion gap: 10 (ref 5–15)
BUN: 20 mg/dL (ref 8–23)
CO2: 24 mmol/L (ref 22–32)
Calcium: 9.4 mg/dL (ref 8.9–10.3)
Chloride: 104 mmol/L (ref 98–111)
Creatinine, Ser: 0.9 mg/dL (ref 0.44–1.00)
GFR, Estimated: >60 mL/min (ref >60)
Glucose, Bld: 114 mg/dL — ABNORMAL HIGH (ref 70–99)
Potassium: 4 mmol/L (ref 3.5–5.1)
Sodium: 138 mmol/L (ref 135–145)

## 2021-06-17 LAB — MAGNESIUM: Magnesium: 2.1 mg/dL (ref 1.7–2.4)

## 2021-06-17 LAB — BRAIN NATRIURETIC PEPTIDE: B Natriuretic Peptide: 127.3 pg/mL — ABNORMAL HIGH (ref 0.0–100.0)

## 2021-06-17 LAB — TSH: TSH: 12.852 u[IU]/mL — ABNORMAL HIGH (ref 0.350–4.500)

## 2021-06-17 MED ORDER — FENTANYL CITRATE PF 50 MCG/ML IJ SOSY
50.0000 ug | PREFILLED_SYRINGE | Freq: Once | INTRAMUSCULAR | Status: AC
  Administered 2021-06-17: 50 ug via INTRAVENOUS
  Filled 2021-06-17: qty 1

## 2021-06-17 MED ORDER — DILTIAZEM HCL 30 MG PO TABS: 30.0000 mg | ORAL_TABLET | Freq: Two times a day (BID) | ORAL | 0 refills | Status: DC

## 2021-06-17 MED ORDER — SODIUM CHLORIDE 0.9 % IV BOLUS
1000.0000 mL | Freq: Once | INTRAVENOUS | Status: AC
  Administered 2021-06-17: 1000 mL via INTRAVENOUS

## 2021-06-17 MED ORDER — DILTIAZEM HCL 25 MG/5ML IV SOLN
20.0000 mg | Freq: Once | INTRAVENOUS | Status: AC
  Administered 2021-06-17: 20 mg via INTRAVENOUS
  Filled 2021-06-17: qty 5

## 2021-06-17 MED ORDER — DILTIAZEM HCL-DEXTROSE 125-5 MG/125ML-% IV SOLN (PREMIX): 5.0000 mg/h | INTRAVENOUS | Status: DC

## 2021-06-17 NOTE — Discharge Instructions (Addendum)
Return for any problem.  Continue Xarelto as previously prescribed  Start taking diltiazem - 30 mg tablets taken orally twice a day - as prescribed.  If you have recurrent episodes of palpitations you may take 1 additional dose of the 30 mg tablet of diltiazem.  If palpitations persist you should seek medical attention.

## 2021-06-17 NOTE — ED Triage Notes (Signed)
Patient with atrial fib rvr rate of 140's from home. Patient with chest pain and pain in between her shoulder blades and shortness of breath.

## 2021-06-17 NOTE — ED Provider Notes (Signed)
Seen after prior EDP.  Patient with well-tolerated paroxysmal A-fib.  She was noted to be in A-fib with RVR upon initial arrival.  After bolus dose of Cardizem patient was able to spontaneously convert back to normal sinus rhythm.  Of note, patient was previously seen 4 days ago for similar presentation.  She was started on Xarelto at that time.  She is reporting compliance with her previously prescribed Xarelto.  After conversion to normal sinus rhythm the patient feels improved.  She desires DC home.  Case discussed briefly with the [facility] health cardiology.  Cardiology agrees with plan for continued use of Xarelto.  They also advised prescribing oral diltiazem - 30 mg taken twice daily.  Patient has established cardiology care in [place].  She plans on following up closely with her already established cardiologist in [place].  Importance of close follow-up stressed.  Strict return precautions given and understood.   Wynetta Fines, MD 06/17/21 1007

## 2021-06-17 NOTE — ED Provider Notes (Addendum)
[facility] EMERGENCY DEPARTMENT Provider Note  CSN: [id number] Arrival date & time: 06/17/21 0536  Chief Complaint(s) Atrial Fibrillation  HPI Kathleen Sellers is a 71 y.o. female with a past medical history listed below who presents to the emergency department in A-fib RVR.  Patient was seen 4 days ago in the emergency department for the same and able to be discharged home after a bolus of diltiazem.  Patient was started on Xarelto.  She is a CHA2DS2-VASc score of 3.  Tonight she returns for A-fib RVR.  This began around 4:30 AM while she was prepping to go to the beach.  She began having shoulder blade pain, checked her heart rate and noted she was tachycardic in A-fib.  This is a similar presentation last time.  She denies any associated chest pain.  She endorses mild shortness of breath with exertion.  Denies any peripheral edema.  No nausea or vomiting.  No recent fevers or infections.    Atrial Fibrillation   Past Medical History Past Medical History:  Diagnosis Date   CAD (coronary artery disease)    Cerebral atrophy (HCC) 02/07/2011   done to assess head trauma ( [facility])   Colon polyps 2005   Depression    Hyperglycemia    Hyperlipidemia    Hypertension    Hypothyroidism    Patient Active Problem List   Diagnosis Date Noted   Macular pucker 05/25/2011   Abnormal shape of pupil 02/14/2011   Mass of breast, left 08/31/2010   PALPITATIONS 07/03/2010   FASTING HYPERGLYCEMIA 12/23/2008   COLONIC POLYPS, HX OF 12/23/2008   HYPOTHYROIDISM 02/06/2007   HYPERLIPIDEMIA 02/06/2007   HYPERTENSION, ESSENTIAL NOS 02/06/2007   C A D 02/06/2007   DEPRESSION 02/04/2007   Home Medication(s) Prior to Admission medications   Medication Sig Start Date End Date Taking? Authorizing Provider  escitalopram (LEXAPRO) 20 MG tablet Take 20 mg by mouth daily.     Yes [provider]  ibuprofen (ADVIL) 200 MG tablet Take 400 mg by mouth every 6 (six)  hours as needed for headache or moderate pain.   Yes [provider]  LORazepam (ATIVAN) 0.5 MG tablet Take 0.25 mg by mouth daily. 05/08/21  Yes [provider]  omeprazole (PRILOSEC) 40 MG capsule Take 40 mg by mouth daily. 05/29/21  Yes [provider]  rivaroxaban (XARELTO) 20 MG TABS tablet Take 1 tablet (20 mg total) by mouth daily with supper. 06/13/21  Yes Milagros Loll, MD  aspirin (ASPIRIN LOW DOSE) 81 MG EC tablet Take 1 tablet (81 mg total) by mouth daily. --- Must have office visit for further refills. Patient not taking: Reported on 06/17/2021 04/07/15   Pecola Lawless, MD  atorvastatin (LIPITOR) 40 MG tablet TAKE 1 TABLET (40 MG TOTAL) BY MOUTH DAILY.   -- NEED LAB WORK AND OFFICE VISIT FOR REFILLS Patient not taking: Reported on 06/17/2021 05/09/15   Pricilla Riffle, MD  benazepril (LOTENSIN) 5 MG tablet 1 by mouth daily Patient not taking: Reported on 06/17/2021 12/11/12   Pecola Lawless, MD  furosemide (LASIX) 40 MG tablet TAKE 1 TABLET (40 MG TOTAL) BY MOUTH DAILY Patient not taking: Reported on 06/17/2021 02/05/14   Pricilla Riffle, MD  levothyroxine (SYNTHROID, LEVOTHROID) 25 MCG tablet TAKE 1 TABLET (25 MCG TOTAL) BY MOUTH DAILY EXCEPT 1&1/2 M,W, F. Patient not taking: Reported on 06/17/2021    Pecola Lawless, MD  ranitidine (ZANTAC) 150 MG tablet TAKE 1  TABLET BY MOUTH TWICE DAILY Patient not taking: Reported on 06/17/2021 11/12/13   Pecola Lawless, MD                                                                                                                                    Allergies Sulfamethoxazole-trimethoprim, Sulfonamide derivatives, and Cefaclor  Review of Systems Review of Systems As noted in HPI  Physical Exam Vital Signs  I have reviewed the triage vital signs BP 126/89    Pulse (!) 143    Temp 98.3 F (36.8 C) (Oral)    Resp 17    SpO2 95%   Physical Exam Vitals reviewed.  Constitutional:      General: She is not in  acute distress.    Appearance: She is well-developed. She is not diaphoretic.  HENT:     Head: Normocephalic and atraumatic.     Nose: Nose normal.  Eyes:     General: No scleral icterus.       Right eye: No discharge.        Left eye: No discharge.     Conjunctiva/sclera: Conjunctivae normal.     Pupils: Pupils are equal, round, and reactive to light.  Cardiovascular:     Rate and Rhythm: Tachycardia present. Rhythm irregularly irregular.     Heart sounds: No murmur heard.   No friction rub. No gallop.  Pulmonary:     Effort: Pulmonary effort is normal. No respiratory distress.     Breath sounds: Normal breath sounds. No stridor. No rales.  Abdominal:     General: There is no distension.     Palpations: Abdomen is soft.     Tenderness: There is no abdominal tenderness.  Musculoskeletal:        General: No tenderness.     Cervical back: Normal range of motion and neck supple.     Right lower leg: 2+ Pitting Edema present.     Left lower leg: 2+ Pitting Edema present.  Skin:    General: Skin is warm and dry.     Findings: No erythema or rash.  Neurological:     Mental Status: She is alert and oriented to person, place, and time.    ED Results and Treatments Labs (all labs ordered are listed, but only abnormal results are displayed) Labs Reviewed  BASIC METABOLIC PANEL - Abnormal; Notable for the following components:      Result Value   Glucose, Bld 114 (*)    All other components within normal limits  TSH - Abnormal; Notable for the following components:   TSH 12.852 (*)    All other components within normal limits  PROTIME-INR - Abnormal; Notable for the following components:   Prothrombin Time 16.1 (*)    INR 1.3 (*)    All other components within normal limits  BRAIN NATRIURETIC PEPTIDE - Abnormal; Notable for the following components:   B Natriuretic Peptide  127.3 (*)    All other components within normal limits  MAGNESIUM  CBC WITH DIFFERENTIAL/PLATELET                                                                                                                          EKG  EKG Interpretation  Date/Time:  Saturday June 17 2021 05:47:55 EST Ventricular Rate:  143 PR Interval:    QRS Duration: 86 QT Interval:  314 QTC Calculation: 484 R Axis:   -57 Text Interpretation: Atrial fibrillation with rapid ventricular response Left anterior fascicular block Abnormal ECG When compared with ECG of 13-Jun-2021 13:31, PREVIOUS ECG IS PRESENT Confirmed by Drema Pryardama, Marvie Brevik [phone]) on 06/17/2021 7:09:35 AM       Radiology DG Chest 1 View  Result Date: 06/17/2021 CLINICAL DATA:  Chest pain.  History of atrial fibrillation. EXAM: CHEST  1 VIEW COMPARISON:  06/13/2021 FINDINGS: Heart size and mediastinal contours are unremarkable. No pleural effusion or edema identified. No airspace densities. IMPRESSION: No acute cardiopulmonary abnormalities. Electronically Signed   By: Signa Kellaylor  [place] M.D.   On: 06/17/2021 06:44    Pertinent labs & imaging results that were available during my care of the patient were reviewed by me and considered in my medical decision making (see MDM for details).  Medications Ordered in ED Medications  diltiazem (CARDIZEM) 125 mg in dextrose 5% 125 mL (1 mg/mL) infusion (has no administration in time range)  sodium chloride 0.9 % bolus 1,000 mL (1,000 mLs Intravenous New Bag/Given 06/17/21 0659)  diltiazem (CARDIZEM) injection 20 mg (20 mg Intravenous Given 06/17/21 0653)  fentaNYL (SUBLIMAZE) injection 50 mcg (50 mcg Intravenous Given 06/17/21 0653)                                                                                                                                     Procedures .Critical Care Performed by: Nira Connardama, Yerick Eggebrecht Eduardo, MD Authorized by: Nira Connardama, Norabelle Kondo Eduardo, MD   Critical care provider statement:    Critical care time (minutes):  45   Critical care time was exclusive of:  Separately billable procedures  and treating other patients   Critical care was necessary to treat or prevent imminent or life-threatening deterioration of the following conditions:  Cardiac failure   Critical care was time spent personally by me on the following activities:  Development of treatment plan with patient or surrogate, discussions with consultants,  evaluation of patient's response to treatment, examination of patient, obtaining history from patient or surrogate, review of old charts, re-evaluation of patient's condition, pulse oximetry, ordering and review of radiographic studies, ordering and review of laboratory studies and ordering and performing treatments and interventions  (including critical care time)  Medical Decision Making / ED Course        A-fib RVR Patient has evidence of volume overload on exam.  During previous ED visit her BNP was around 300.  We will repeat. We will also repeat basic labs and chest x-ray.  Work-up ordered to assess concerns above.Labs and imaging independently interpreted by me and noted below: CBC without leukocytosis or anemia Metabolic panel without significant electrolyte derangement or renal sufficiency Magnesium within normal limits TSH 12.8. Chest x-ray without evidence of pneumonia, pulmonary edema.  Management: Put on a low volume IV fluid bolus We will give 20 mg of diltiazem. Low-dose IV fentanyl for pain.  Reassessment: 7:02 AM: HDS. Still in AFRVR to 134 on tele. 8:06 AM: now in SNR at 58 on tele  Plan to monitor and discuss case with cardiology. Possible discharge if she remains stable.    Patient care turned over to oncoming provider. Patient case and results discussed in detail; please see their note for further ED managment.     Final Clinical Impression(s) / ED Diagnoses Final diagnoses:  Atrial fibrillation with RVR (HCC)           This chart was dictated using voice recognition software.  Despite best efforts to proofread,  errors  can occur which can change the documentation meaning.      Nira Conn, MD 06/17/21 [phone]

## 2021-06-27 ENCOUNTER — Ambulatory Visit (HOSPITAL_COMMUNITY)
Admission: RE | Admit: 2021-06-27 | Discharge: 2021-06-27 | Disposition: A | Discharge status: Home / Self Care | Payer: PRIVATE HEALTH INSURANCE | Source: Ambulatory Visit | Attending: Physician Assistant | Admitting: Physician Assistant

## 2021-06-27 ENCOUNTER — Encounter (HOSPITAL_COMMUNITY): Payer: Self-pay | Admitting: Physician Assistant

## 2021-06-27 ENCOUNTER — Other Ambulatory Visit: Payer: Self-pay

## 2021-06-27 VITALS — BP 150/90 | HR 76 | Ht 63.05 in | Wt 206.2 lb

## 2021-06-27 DIAGNOSIS — Z7901 Long term (current) use of anticoagulants: Secondary | ICD-10-CM | POA: Diagnosis not present

## 2021-06-27 DIAGNOSIS — I2510 Atherosclerotic heart disease of native coronary artery without angina pectoris: Secondary | ICD-10-CM | POA: Diagnosis not present

## 2021-06-27 DIAGNOSIS — Z79899 Other long term (current) drug therapy: Secondary | ICD-10-CM | POA: Insufficient documentation

## 2021-06-27 DIAGNOSIS — G4733 Obstructive sleep apnea (adult) (pediatric): Secondary | ICD-10-CM | POA: Diagnosis not present

## 2021-06-27 DIAGNOSIS — I480 Paroxysmal atrial fibrillation: Secondary | ICD-10-CM | POA: Diagnosis not present

## 2021-06-27 DIAGNOSIS — E669 Obesity, unspecified: Secondary | ICD-10-CM | POA: Diagnosis not present

## 2021-06-27 DIAGNOSIS — E785 Hyperlipidemia, unspecified: Secondary | ICD-10-CM | POA: Diagnosis not present

## 2021-06-27 DIAGNOSIS — D6869 Other thrombophilia: Secondary | ICD-10-CM | POA: Diagnosis not present

## 2021-06-27 DIAGNOSIS — Z6836 Body mass index (BMI) 36.0-36.9, adult: Secondary | ICD-10-CM | POA: Diagnosis not present

## 2021-06-27 DIAGNOSIS — I10 Essential (primary) hypertension: Secondary | ICD-10-CM | POA: Diagnosis not present

## 2021-06-27 NOTE — Progress Notes (Signed)
Primary Care Physician: Pecola Lawless, MD Primary Cardiologist: Dr Delton Coombes [facility]) Primary Electrophysiologist: none Referring Physician: [facility]   Kathleen Sellers is a 71 y.o. female with a history of CAD (LHC 2003), HTN, HLD, OSA, atrial fibrillation who presents for consultation in the [facility].  The patient was initially diagnosed with atrial fibrillation 06/13/21 after presenting to urgent care with back pain. She was found to be in afib with RVR and sent to the ED for evaluation. She converted to SR with bolus of diltiazem and was started on Xarelto for a CHADS2VASC score of 4. She returned to the ED 06/17/21 with subscapular pain and was again in afib with RVR. She again converted to SR with diltiazem. She does have a history of OSA and has not been on CPAP for years. She denies any alcohol use. She has not had any further symptoms since starting diltiazem.   Today, she denies symptoms of palpitations, chest pain, shortness of breath, orthopnea, PND, lower extremity edema, dizziness, presyncope, syncope, snoring, daytime somnolence, bleeding, or neurologic sequela. The patient is tolerating medications without difficulties and is otherwise without complaint today.    Atrial Fibrillation Risk Factors:  she does have symptoms or diagnosis of sleep apnea. she is not compliant with CPAP therapy. she does not have a history of rheumatic fever. she does not have a history of alcohol use. The patient does not have a history of early familial atrial fibrillation or other arrhythmias.  she has a BMI of Body mass index is 36.47 kg/m.Marland Kitchen Filed Weights   06/27/21 0902  Weight: 93.5 kg    Family History  Problem Relation Age of Onset   Stroke Father        cerebral hemorrhage   Diabetes Mother    Hypertension Mother    Lung cancer Mother        with bone metastases   Coronary artery disease Paternal Uncle    Breast cancer Maternal Grandmother     Heart attack Paternal Grandfather        in 28s     Atrial Fibrillation Management history:  Previous antiarrhythmic drugs: none Previous cardioversions: none Previous ablations: none CHADS2VASC score: 4 Anticoagulation history: Xarelto   Past Medical History:  Diagnosis Date   CAD (coronary artery disease)    Cerebral atrophy (HCC) 02/07/2011   done to assess head trauma ( [facility])   Colon polyps 2005   Depression    Hyperglycemia    Hyperlipidemia    Hypertension    Hypothyroidism    Past Surgical History:  Procedure Laterality Date   APPENDECTOMY  1970   with USO   CARDIAC CATHETERIZATION  2003   CATARACT EXTRACTION     Dr Nile Riggs   CHOLECYSTECTOMY  2005   COLONOSCOPY W/ POLYPECTOMY  2006   Dr. Evette Cristal   Hysterectomy & USO  1995   NASAL SINUS SURGERY  1988   Dr. Haroldine Laws   OOPHORECTOMY  1970   benign tumor    Current Outpatient Medications  Medication Sig Dispense Refill   diltiazem (CARDIZEM) 30 MG tablet Take 1 tablet (30 mg total) by mouth 2 (two) times daily. 60 tablet 0   escitalopram (LEXAPRO) 20 MG tablet Take 20 mg by mouth daily.       LORazepam (ATIVAN) 0.5 MG tablet Take 0.25 mg by mouth daily.     omeprazole (PRILOSEC) 40 MG capsule Take 40 mg by mouth daily.  rivaroxaban (XARELTO) 20 MG TABS tablet Take 1 tablet (20 mg total) by mouth daily with supper. 30 tablet 0   No current facility-administered medications for this encounter.    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    Employer: [employer]  Tobacco Use   Smoking status: Never   Smokeless  tobacco: Never  Substance and Sexual Activity   Alcohol use: No   Drug use: No   Sexual activity: Not on file  Other Topics Concern   Not on file  Social History Narrative   No reg exercise   Social Determinants of Health   Financial Resource Strain: Not on file  Food Insecurity: Not on file  Transportation Needs: Not on file  Physical Activity: Not on file  Stress: Not on file  Social Connections: Not on file  Intimate Partner Violence: Not on file     ROS- All systems are reviewed and negative except as per the HPI above.  Physical Exam: Vitals:   06/27/21 0902  BP: (!) 150/90  Pulse: 76  Weight: 93.5 kg  Height: 5' 3.05" (1.601 m)    GEN- The patient is a well appearing obese female, alert and oriented x 3 today.   Head- normocephalic, atraumatic Eyes-  Sclera clear, conjunctiva pink Ears- hearing intact Oropharynx- clear Neck- supple  Lungs- Clear to ausculation bilaterally, normal work of breathing Heart- Regular rate and rhythm, no murmurs, rubs or gallops  GI- soft, NT, ND, + BS Extremities- no clubbing, cyanosis, or edema MS- no significant deformity or atrophy Skin- no rash or lesion Psych- euthymic mood, full affect Neuro- strength and sensation are intact  Wt Readings from Last 3 Encounters:  06/27/21 93.5 kg  03/19/14 79.7 kg  09/16/13 78.5 kg    EKG today demonstrates  SR, LAFB Vent. rate 76 BPM PR interval 132 ms QRS duration 92 ms QT/QTcB 406/456 ms  Stress Echo 01/11/20 SUMMARY The patient had no chest pain. The patient achieved 93 % of maximum predicted heart rate. The METS achieved was 5. Exercise capacity was fair. Normal left ventricular function and global wall motion with stress. Negative exercise echocardiography for inducible ischemia at target heart rate.  Epic records are reviewed at length today  CHA2DS2-VASc Score = 4  The patient's score is based upon: CHF History: 0 HTN History: 1 Diabetes History: 0 Stroke  History: 0 Vascular Disease History: 1 Age Score: 1 Gender Score: 1       ASSESSMENT AND PLAN: 1. Paroxysmal Atrial Fibrillation (ICD10:  I48.0) The patient's CHA2DS2-VASc score is 4, indicating a 4.8% annual risk of stroke.   General education about afib provided and questions answered. We also discussed her stroke risk and the risks and benefits of anticoagulation. Suspect her afib is related to untreated OSA. Continue Xarelto 20 mg daily Would recommend echocardiogram Continue diltiazem 30 mg BID  2. Secondary Hypercoagulable State (ICD10:  D68.69) The patient is at significant risk for stroke/thromboembolism based upon her CHA2DS2-VASc Score of 4.  Continue Rivaroxaban (Xarelto).   3.  Obesity Body mass index is 36.47 kg/m. Lifestyle modification was discussed at length including regular exercise and weight reduction.  4. Obstructive sleep apnea The importance of adequate treatment of sleep apnea was discussed today in order to improve our ability to maintain sinus rhythm long term. Patient would like to have repeat sleep study through [facility].  5. CAD Nonobstructive on LHC in 2003 No anginal symptoms.  6. HTN Stable, no changes today.   Follow up with Dr Delton Coombes as scheduled on 07/07/21.    Jorja Loa PA-C Afib Clinic [facility] [address] [place], [place] [address] [phone] 06/27/2021 11:58 AM

## 2021-07-02 ENCOUNTER — Encounter (HOSPITAL_COMMUNITY): Payer: Self-pay | Admitting: Emergency Medicine

## 2021-07-02 ENCOUNTER — Inpatient Hospital Stay (HOSPITAL_COMMUNITY)
Admission: EM | Admit: 2021-07-02 | Discharge: 2021-07-06 | DRG: 310 | Disposition: A | Discharge status: Home / Self Care | Payer: PRIVATE HEALTH INSURANCE | Attending: Cardiology | Admitting: Cardiology

## 2021-07-02 ENCOUNTER — Other Ambulatory Visit: Payer: Self-pay

## 2021-07-02 DIAGNOSIS — Z8249 Family history of ischemic heart disease and other diseases of the circulatory system: Secondary | ICD-10-CM

## 2021-07-02 DIAGNOSIS — I4819 Other persistent atrial fibrillation: Secondary | ICD-10-CM | POA: Diagnosis not present

## 2021-07-02 DIAGNOSIS — G4733 Obstructive sleep apnea (adult) (pediatric): Secondary | ICD-10-CM | POA: Diagnosis present

## 2021-07-02 DIAGNOSIS — Z20822 Contact with and (suspected) exposure to covid-19: Secondary | ICD-10-CM | POA: Diagnosis present

## 2021-07-02 DIAGNOSIS — Z6834 Body mass index (BMI) 34.0-34.9, adult: Secondary | ICD-10-CM

## 2021-07-02 DIAGNOSIS — I4891 Unspecified atrial fibrillation: Secondary | ICD-10-CM | POA: Diagnosis present

## 2021-07-02 DIAGNOSIS — Z882 Allergy status to sulfonamides status: Secondary | ICD-10-CM

## 2021-07-02 DIAGNOSIS — Z7901 Long term (current) use of anticoagulants: Secondary | ICD-10-CM

## 2021-07-02 DIAGNOSIS — Z881 Allergy status to other antibiotic agents status: Secondary | ICD-10-CM

## 2021-07-02 DIAGNOSIS — Z801 Family history of malignant neoplasm of trachea, bronchus and lung: Secondary | ICD-10-CM

## 2021-07-02 DIAGNOSIS — E039 Hypothyroidism, unspecified: Secondary | ICD-10-CM | POA: Diagnosis present

## 2021-07-02 DIAGNOSIS — Z823 Family history of stroke: Secondary | ICD-10-CM

## 2021-07-02 DIAGNOSIS — F32A Depression, unspecified: Secondary | ICD-10-CM | POA: Diagnosis present

## 2021-07-02 DIAGNOSIS — Z79899 Other long term (current) drug therapy: Secondary | ICD-10-CM

## 2021-07-02 DIAGNOSIS — Z803 Family history of malignant neoplasm of breast: Secondary | ICD-10-CM

## 2021-07-02 DIAGNOSIS — I2510 Atherosclerotic heart disease of native coronary artery without angina pectoris: Secondary | ICD-10-CM | POA: Diagnosis present

## 2021-07-02 DIAGNOSIS — E782 Mixed hyperlipidemia: Secondary | ICD-10-CM | POA: Diagnosis present

## 2021-07-02 DIAGNOSIS — M25512 Pain in left shoulder: Secondary | ICD-10-CM | POA: Diagnosis present

## 2021-07-02 DIAGNOSIS — E669 Obesity, unspecified: Secondary | ICD-10-CM | POA: Diagnosis present

## 2021-07-02 LAB — CBC
HCT: 44.9 % (ref 36.0–46.0)
Hemoglobin: 15.1 g/dL — ABNORMAL HIGH (ref 12.0–15.0)
MCH: 29 pg (ref 26.0–34.0)
MCHC: 33.6 g/dL (ref 30.0–36.0)
MCV: 86.2 fL (ref 80.0–100.0)
Platelets: 263 10*3/uL (ref 150–400)
RBC: 5.21 MIL/uL — ABNORMAL HIGH (ref 3.87–5.11)
RDW: 13.5 % (ref 11.5–15.5)
WBC: 6.6 10*3/uL (ref 4.0–10.5)
nRBC: 0 % (ref 0.0–0.2)

## 2021-07-02 LAB — BASIC METABOLIC PANEL
Anion gap: 11 (ref 5–15)
BUN: 20 mg/dL (ref 8–23)
CO2: 20 mmol/L — ABNORMAL LOW (ref 22–32)
Calcium: 9.2 mg/dL (ref 8.9–10.3)
Chloride: 106 mmol/L (ref 98–111)
Creatinine, Ser: 0.96 mg/dL (ref 0.44–1.00)
GFR, Estimated: >60 mL/min (ref >60)
Glucose, Bld: 111 mg/dL — ABNORMAL HIGH (ref 70–99)
Potassium: 4.1 mmol/L (ref 3.5–5.1)
Sodium: 137 mmol/L (ref 135–145)

## 2021-07-02 LAB — TROPONIN I (HIGH SENSITIVITY)
Troponin I (High Sensitivity): 9 ng/L (ref <18)
Troponin I (High Sensitivity): 9 ng/L (ref <18)

## 2021-07-02 MED ORDER — SODIUM CHLORIDE 0.9 % IV BOLUS
1000.0000 mL | Freq: Once | INTRAVENOUS | Status: AC
  Administered 2021-07-02: 1000 mL via INTRAVENOUS

## 2021-07-02 MED ORDER — DILTIAZEM HCL 60 MG PO TABS
60.0000 mg | ORAL_TABLET | Freq: Once | ORAL | Status: AC
  Administered 2021-07-02: 60 mg via ORAL
  Filled 2021-07-02: qty 1

## 2021-07-02 MED ORDER — DILTIAZEM HCL-DEXTROSE 125-5 MG/125ML-% IV SOLN (PREMIX)
5.0000 mg/h | INTRAVENOUS | Status: DC
  Administered 2021-07-02: 5 mg/h via INTRAVENOUS
  Filled 2021-07-02: qty 125

## 2021-07-02 MED ORDER — DILTIAZEM LOAD VIA INFUSION
20.0000 mg | Freq: Once | INTRAVENOUS | Status: AC
  Administered 2021-07-02: 20 mg via INTRAVENOUS
  Filled 2021-07-02: qty 20

## 2021-07-02 NOTE — ED Provider Notes (Signed)
?[place] ?Provider Note ? ? ?CSN: [id number] ?Arrival date & time: 07/02/21  1929 ? ?  ? ?History ? ?Chief Complaint  ?Patient presents with  ? Chest Pain  ? ? ?Kathleen Sellers is a 71 y.o. female. ? ?71 yo F with a chief complaints of atrial fibrillation with RVR.  The patient has a recent history of this.  Is now been to the hospital 3 times for similar symptoms.  Both times previously her symptoms resolved spontaneously.  She tells me that today she was mowing the lawn and felt it onset.  She states compliance with her medications.  She was told that she could take an extra dose of her rate controlling agent should she feel palpitations.  She tried this about 5 hours ago but has not had any improvement and so came to the ED for evaluation.  Is starting to feel little heaviness in her chest and her shoulders. ? ? ?Chest Pain ? ?  ? ?Home Medications ?Prior to Admission medications   ?Medication Sig Start Date End Date Taking? Authorizing Provider  ?diltiazem (CARDIZEM) 30 MG tablet Take 1 tablet (30 mg total) by mouth 2 (two) times daily. 06/17/21  Yes Valarie Merino, MD  ?escitalopram (LEXAPRO) 20 MG tablet Take 20 mg by mouth daily.     Yes [provider]  ?LORazepam (ATIVAN) 0.5 MG tablet Take 0.5 mg by mouth daily. 05/08/21  Yes [provider]  ?omeprazole (PRILOSEC) 40 MG capsule Take 40 mg by mouth daily. 05/29/21  Yes [provider]  ?rivaroxaban (XARELTO) 20 MG TABS tablet Take 1 tablet (20 mg total) by mouth daily with supper. 06/13/21  Yes Lucrezia Starch, MD  ?   ? ?Allergies    ?Sulfamethoxazole-trimethoprim, Sulfonamide derivatives, and Cefaclor   ? ?Review of Systems   ?Review of Systems  ?Cardiovascular:  Positive for chest pain.  ? ?Physical Exam ?Updated Vital Signs ?BP (!) 116/94   Pulse (!) 51   Temp 98.3 ?F (36.8 ?C) (Oral)   Resp 18   SpO2 94%  ?Physical Exam ?Vitals and nursing note reviewed.  ?Constitutional:   ?    General: She is not in acute distress. ?   Appearance: She is well-developed. She is not diaphoretic.  ?HENT:  ?   Head: Normocephalic and atraumatic.  ?Eyes:  ?   Pupils: Pupils are equal, round, and reactive to light.  ?Cardiovascular:  ?   Rate and Rhythm: Tachycardia present. Rhythm irregular.  ?   Heart sounds: No murmur heard. ?  No friction rub. No gallop.  ?Pulmonary:  ?   Effort: Pulmonary effort is normal.  ?   Breath sounds: No wheezing or rales.  ?Abdominal:  ?   General: There is no distension.  ?   Palpations: Abdomen is soft.  ?   Tenderness: There is no abdominal tenderness.  ?Musculoskeletal:     ?   General: No tenderness.  ?   Cervical back: Normal range of motion and neck supple.  ?Skin: ?   General: Skin is warm and dry.  ?Neurological:  ?   Mental Status: She is alert and oriented to person, place, and time.  ?Psychiatric:     ?   Behavior: Behavior normal.  ? ? ?ED Results / Procedures / Treatments   ?Labs ?(all labs ordered are listed, but only abnormal results are displayed) ?Labs Reviewed  ?BASIC METABOLIC PANEL - Abnormal; Notable for the following components:  ?  Result Value  ? CO2 20 (*)   ? Glucose, Bld 111 (*)   ? All other components within normal limits  ?CBC - Abnormal; Notable for the following components:  ? RBC 5.21 (*)   ? Hemoglobin 15.1 (*)   ? All other components within normal limits  ?TROPONIN I (HIGH SENSITIVITY)  ?TROPONIN I (HIGH SENSITIVITY)  ? ? ?EKG ?EKG Interpretation ? ?Date/Time:  Sunday July 02 2021 19:36:44 EST ?Ventricular Rate:  145 ?PR Interval:  128 ?QRS Duration: 86 ?QT Interval:  276 ?QTC Calculation: 428 ?R Axis:   -44 ?Text Interpretation: Sinus tachycardia vs afib w rvr Left anterior fascicular block Abnormal ECG Since last tracing rate faster Otherwise no significant change Confirmed by Deno Etienne [phone]) on 07/02/2021 8:10:48 PM ? ?Radiology ?No results found. ? ?Procedures ?Procedures  ? ? ?Medications Ordered in ED ?Medications  ?diltiazem  (CARDIZEM) 1 mg/mL load via infusion 20 mg (20 mg Intravenous Bolus from Bag 07/02/21 2040)  ?  And  ?diltiazem (CARDIZEM) 125 mg in dextrose 5% 125 mL (1 mg/mL) infusion (5 mg/hr Intravenous New Bag/Given 07/02/21 2039)  ?sodium chloride 0.9 % bolus 1,000 mL (0 mLs Intravenous Stopped 07/02/21 2143)  ?diltiazem (CARDIZEM) tablet 60 mg (60 mg Oral Given 07/02/21 2214)  ? ? ?ED Course/ Medical Decision Making/ A&P ?Clinical Course as of 07/02/21 2326  ?Sun Jul 02, 2021  ?2312 Pending cardio eval [[facility]]  ?  ?Clinical Course User Index ?[[facility]] Almyra Free Greggory Brandy, MD  ? ?                        ?Medical Decision Making ?Amount and/or Complexity of Data Reviewed ?Labs: ordered. ? ?Risk ?Prescription drug management. ? ? ?71 yo F with a chief complaint of atrial fibrillation with RVR.  This is now the third visit to the ER in the past few weeks.  I am not sure if she would benefit from urgent rhythm control as she seems to keep going into A-fib.  Will give a bolus of fluids that she thinks may be dehydrated give a bolus of Cardizem and started on a Cardizem drip. ? ?No anemia, no electrolyte abnormality.  No significant change to renal function.  Troponin negative. ? ?Patient was started on a diltiazem infusion and has had some improvement of her rate.  Now in the 3s.  She feels much better.  I discussed with her about going home on an increase of her rate controlling agent however she is a bit apprehensive as the last few times she has been in the hospital she converted spontaneously.  I discussed the case with Dr. Conley Canal, cardiology will come and evaluate the patient at bedside. ? ?Patient care was signed out to Dr. Almyra Free, Please see their note for further details of care in the ED.  ? ?CHA2DS2/VAS Stroke Risk Points  Current as of 8 minutes ago  ?   4 >= 2 Points: High Risk  ?1 - 1.99 Points: Medium Risk  ?0 Points: Low Risk  ?  No Change   ?  ? Details   ? This score determines the patient's risk of having a stroke if the  ?patient  has atrial fibrillation.  ?  ?  ? Points Metrics  ?0 Has Congestive Heart Failure:  No   ? Current as of 8 minutes ago  ?1 Has Vascular Disease:  Yes   ? Current as of 8 minutes ago  ?1 Has Hypertension:  Yes   ? Current as of 8 minutes ago  ?1 Age:  47   ? Current as of 8 minutes ago  ?0 Has Diabetes:  No   ? Current as of 8 minutes ago  ?0 Had Stroke:  No  Had TIA:  No  Had Thromboembolism:  No   ? Current as of 8 minutes ago  ?1 Female:  Yes   ? Current as of 8 minutes ago  ?  ?  ?  ?  ?The patients results and plan were reviewed and discussed.   ?Any x-rays performed were independently reviewed by myself.  ? ?Differential diagnosis were considered with the presenting HPI. ? ?Medications  ?diltiazem (CARDIZEM) 1 mg/mL load via infusion 20 mg (20 mg Intravenous Bolus from Bag 07/02/21 2040)  ?  And  ?diltiazem (CARDIZEM) 125 mg in dextrose 5% 125 mL (1 mg/mL) infusion (5 mg/hr Intravenous New Bag/Given 07/02/21 2039)  ?sodium chloride 0.9 % bolus 1,000 mL (0 mLs Intravenous Stopped 07/02/21 2143)  ?diltiazem (CARDIZEM) tablet 60 mg (60 mg Oral Given 07/02/21 2214)  ? ? ?Vitals:  ? 07/02/21 2130 07/02/21 2145 07/02/21 2200 07/02/21 2215  ?BP: 108/77 116/80 110/68 (!) 116/94  ?Pulse: 75 (!) 59 69 (!) 51  ?Resp: 16 14 13 18   ?Temp:      ?TempSrc:      ?SpO2: 100% 99% 95% 94%  ? ? ?Final diagnoses:  ?Atrial fibrillation with rapid ventricular response ([facility])  ? ? ? ? ? ? ? ? ?Final Clinical Impression(s) / ED Diagnoses ?Final diagnoses:  ?Atrial fibrillation with rapid ventricular response ([place])  ? ? ?Rx / DC Orders ?ED Discharge Orders   ? ? None  ? ?  ? ? ?  ?Deno Etienne, DO ?07/02/21 2326 ? ?

## 2021-07-02 NOTE — ED Triage Notes (Signed)
Per pt "my AFIB is back again."  Pt reports she was out mowing and she started to "get that feeling." She took her diltiazem at 7am then when the symptoms started at 3:50pm but she continues to feel chest pain between her shoulder blades.  ?

## 2021-07-02 NOTE — ED Notes (Signed)
ED Provider at bedside. 

## 2021-07-03 ENCOUNTER — Inpatient Hospital Stay (HOSPITAL_COMMUNITY): Payer: PRIVATE HEALTH INSURANCE

## 2021-07-03 ENCOUNTER — Encounter (HOSPITAL_COMMUNITY): Payer: Self-pay | Admitting: Student in an Organized Health Care Education/Training Program

## 2021-07-03 ENCOUNTER — Other Ambulatory Visit (HOSPITAL_COMMUNITY): Payer: Self-pay

## 2021-07-03 DIAGNOSIS — Z79899 Other long term (current) drug therapy: Secondary | ICD-10-CM | POA: Diagnosis not present

## 2021-07-03 DIAGNOSIS — Z882 Allergy status to sulfonamides status: Secondary | ICD-10-CM | POA: Diagnosis not present

## 2021-07-03 DIAGNOSIS — I4891 Unspecified atrial fibrillation: Secondary | ICD-10-CM

## 2021-07-03 DIAGNOSIS — I10 Essential (primary) hypertension: Secondary | ICD-10-CM

## 2021-07-03 DIAGNOSIS — Z823 Family history of stroke: Secondary | ICD-10-CM | POA: Diagnosis not present

## 2021-07-03 DIAGNOSIS — Z8249 Family history of ischemic heart disease and other diseases of the circulatory system: Secondary | ICD-10-CM | POA: Diagnosis not present

## 2021-07-03 DIAGNOSIS — M25512 Pain in left shoulder: Secondary | ICD-10-CM | POA: Diagnosis present

## 2021-07-03 DIAGNOSIS — I495 Sick sinus syndrome: Secondary | ICD-10-CM

## 2021-07-03 DIAGNOSIS — Z7901 Long term (current) use of anticoagulants: Secondary | ICD-10-CM | POA: Diagnosis not present

## 2021-07-03 DIAGNOSIS — R931 Abnormal findings on diagnostic imaging of heart and coronary circulation: Secondary | ICD-10-CM | POA: Diagnosis not present

## 2021-07-03 DIAGNOSIS — Z20822 Contact with and (suspected) exposure to covid-19: Secondary | ICD-10-CM | POA: Diagnosis present

## 2021-07-03 DIAGNOSIS — Z803 Family history of malignant neoplasm of breast: Secondary | ICD-10-CM | POA: Diagnosis not present

## 2021-07-03 DIAGNOSIS — I4819 Other persistent atrial fibrillation: Secondary | ICD-10-CM | POA: Diagnosis present

## 2021-07-03 DIAGNOSIS — E782 Mixed hyperlipidemia: Secondary | ICD-10-CM | POA: Diagnosis present

## 2021-07-03 DIAGNOSIS — Z881 Allergy status to other antibiotic agents status: Secondary | ICD-10-CM | POA: Diagnosis not present

## 2021-07-03 DIAGNOSIS — E039 Hypothyroidism, unspecified: Secondary | ICD-10-CM | POA: Diagnosis present

## 2021-07-03 DIAGNOSIS — I2510 Atherosclerotic heart disease of native coronary artery without angina pectoris: Secondary | ICD-10-CM | POA: Diagnosis present

## 2021-07-03 DIAGNOSIS — E669 Obesity, unspecified: Secondary | ICD-10-CM | POA: Diagnosis present

## 2021-07-03 DIAGNOSIS — E785 Hyperlipidemia, unspecified: Secondary | ICD-10-CM

## 2021-07-03 DIAGNOSIS — G4733 Obstructive sleep apnea (adult) (pediatric): Secondary | ICD-10-CM | POA: Diagnosis present

## 2021-07-03 DIAGNOSIS — Z801 Family history of malignant neoplasm of trachea, bronchus and lung: Secondary | ICD-10-CM | POA: Diagnosis not present

## 2021-07-03 DIAGNOSIS — Z6834 Body mass index (BMI) 34.0-34.9, adult: Secondary | ICD-10-CM | POA: Diagnosis not present

## 2021-07-03 DIAGNOSIS — F32A Depression, unspecified: Secondary | ICD-10-CM | POA: Diagnosis present

## 2021-07-03 LAB — MAGNESIUM
Magnesium: 2.1 mg/dL (ref 1.7–2.4)
Magnesium: 2.1 mg/dL (ref 1.7–2.4)

## 2021-07-03 LAB — ECHOCARDIOGRAM COMPLETE
AR max vel: 2.91 cm2
AV Peak grad: 10.6 mmHg
Ao pk vel: 1.63 m/s
Area-P 1/2: 3.24 cm2
Calc EF: 58.5 %
Height: 64 in
P 1/2 time: 691 msec
S' Lateral: 3 cm
Single Plane A2C EF: 58.6 %
Single Plane A4C EF: 58.2 %
Weight: 3232 oz

## 2021-07-03 LAB — BASIC METABOLIC PANEL
Anion gap: 7 (ref 5–15)
Anion gap: 6 (ref 5–15)
BUN: 18 mg/dL (ref 8–23)
BUN: 16 mg/dL (ref 8–23)
CO2: 24 mmol/L (ref 22–32)
CO2: 25 mmol/L (ref 22–32)
Calcium: 8.8 mg/dL — ABNORMAL LOW (ref 8.9–10.3)
Calcium: 8.9 mg/dL (ref 8.9–10.3)
Chloride: 108 mmol/L (ref 98–111)
Chloride: 106 mmol/L (ref 98–111)
Creatinine, Ser: 1.02 mg/dL — ABNORMAL HIGH (ref 0.44–1.00)
Creatinine, Ser: 0.94 mg/dL (ref 0.44–1.00)
GFR, Estimated: 59 mL/min — ABNORMAL LOW (ref >60)
GFR, Estimated: >60 mL/min (ref >60)
Glucose, Bld: 111 mg/dL — ABNORMAL HIGH (ref 70–99)
Glucose, Bld: 93 mg/dL (ref 70–99)
Potassium: 4 mmol/L (ref 3.5–5.1)
Potassium: 4.2 mmol/L (ref 3.5–5.1)
Sodium: 139 mmol/L (ref 135–145)
Sodium: 137 mmol/L (ref 135–145)

## 2021-07-03 LAB — HIV ANTIBODY (ROUTINE TESTING W REFLEX): HIV Screen 4th Generation wRfx: NONREACTIVE

## 2021-07-03 LAB — TSH: TSH: 10.517 u[IU]/mL — ABNORMAL HIGH (ref 0.350–4.500)

## 2021-07-03 LAB — PROTIME-INR
INR: 1.5 — ABNORMAL HIGH (ref 0.8–1.2)
Prothrombin Time: 18.2 seconds — ABNORMAL HIGH (ref 11.4–15.2)

## 2021-07-03 LAB — HEMOGLOBIN A1C
Hgb A1c MFr Bld: 5.2 % (ref 4.8–5.6)
Mean Plasma Glucose: 102.54 mg/dL

## 2021-07-03 LAB — RESP PANEL BY RT-PCR (FLU A&B, COVID) ARPGX2
Influenza A by PCR: NEGATIVE
Influenza B by PCR: NEGATIVE
SARS Coronavirus 2 by RT PCR: NEGATIVE

## 2021-07-03 LAB — HEPARIN LEVEL (UNFRACTIONATED): Heparin Unfractionated: >1.1 IU/mL — ABNORMAL HIGH (ref 0.30–0.70)

## 2021-07-03 LAB — APTT: aPTT: 33 seconds (ref 24–36)

## 2021-07-03 MED ORDER — DOFETILIDE 500 MCG PO CAPS
500.0000 ug | ORAL_CAPSULE | Freq: Two times a day (BID) | ORAL | Status: DC
  Administered 2021-07-03 – 2021-07-05 (×4): 500 ug via ORAL
  Filled 2021-07-03 (×4): qty 1

## 2021-07-03 MED ORDER — ESCITALOPRAM OXALATE 10 MG PO TABS
20.0000 mg | ORAL_TABLET | Freq: Every day | ORAL | Status: DC
  Administered 2021-07-03: 20 mg via ORAL
  Filled 2021-07-03: qty 2

## 2021-07-03 MED ORDER — LORAZEPAM 0.5 MG PO TABS
0.5000 mg | ORAL_TABLET | Freq: Every day | ORAL | Status: DC
Start: 2021-07-03 — End: 2021-07-06
  Administered 2021-07-03 – 2021-07-06 (×4): 0.5 mg via ORAL
  Filled 2021-07-03 (×4): qty 1

## 2021-07-03 MED ORDER — RIVAROXABAN 20 MG PO TABS
20.0000 mg | ORAL_TABLET | Freq: Every day | ORAL | Status: DC
  Administered 2021-07-03 – 2021-07-05 (×3): 20 mg via ORAL
  Filled 2021-07-03 (×3): qty 1

## 2021-07-03 MED ORDER — PANTOPRAZOLE SODIUM 40 MG PO TBEC
40.0000 mg | DELAYED_RELEASE_TABLET | Freq: Every day | ORAL | Status: DC
Start: 2021-07-03 — End: 2021-07-06
  Administered 2021-07-03 – 2021-07-06 (×4): 40 mg via ORAL
  Filled 2021-07-03 (×4): qty 1

## 2021-07-03 MED ORDER — SODIUM CHLORIDE 0.9 % IV SOLN: 250.0000 mL | INTRAVENOUS | Status: DC | PRN

## 2021-07-03 MED ORDER — SODIUM CHLORIDE 0.9% FLUSH: 3.0000 mL | INTRAVENOUS | Status: DC | PRN

## 2021-07-03 MED ORDER — ONDANSETRON HCL 4 MG/2ML IJ SOLN: 4.0000 mg | Freq: Four times a day (QID) | INTRAMUSCULAR | Status: DC | PRN

## 2021-07-03 MED ORDER — HEPARIN (PORCINE) 25000 UT/250ML-% IV SOLN: 1100.0000 [IU]/h | INTRAVENOUS | Status: DC

## 2021-07-03 MED ORDER — ACETAMINOPHEN 325 MG PO TABS
650.0000 mg | ORAL_TABLET | ORAL | Status: DC | PRN
  Administered 2021-07-04: 650 mg via ORAL
  Filled 2021-07-03: qty 2

## 2021-07-03 MED ORDER — SERTRALINE HCL 50 MG PO TABS
50.0000 mg | ORAL_TABLET | Freq: Every day | ORAL | Status: DC
  Administered 2021-07-04 – 2021-07-06 (×3): 50 mg via ORAL
  Filled 2021-07-03 (×3): qty 1

## 2021-07-03 MED ORDER — SODIUM CHLORIDE 0.9% FLUSH
3.0000 mL | Freq: Two times a day (BID) | INTRAVENOUS | Status: DC
  Administered 2021-07-03 – 2021-07-05 (×6): 3 mL via INTRAVENOUS

## 2021-07-03 MED ORDER — OFF THE BEAT BOOK
Freq: Once | Status: AC
Start: 2021-07-03 — End: 2021-07-04
  Filled 2021-07-03: qty 1

## 2021-07-03 NOTE — ED Notes (Signed)
Defibrillation pads applied per MD instruction. Crash cart in hallway outside pt's door. Pt denies any complaints at this time. Will continue to monitor.  ?

## 2021-07-03 NOTE — Progress Notes (Signed)
Pharmacy: Dofetilide (Tikosyn) - Initial Consult ?Assessment and Electrolyte Replacement ? ?Pharmacy consulted to assist in monitoring and replacing electrolytes in this 71 y.o. female admitted on 07/02/2021 undergoing dofetilide initiation. First dofetilide dose: 07/03/21 pm  ? ?Assessment: ?Dose ok dofetilide 561mcg q12h Crcl >81ml/min ? ?Patient Exclusion Criteria: If any screening criteria checked as "Yes", then  patient  should NOT receive dofetilide until criteria item is corrected.  ?If ?Yes? please indicate correction plan. ? ?YES  NO Patient  Exclusion Criteria Correction Plan  ? ?[]   ?[x]   ?Baseline QTc interval is greater than or equal to 440 msec. ?IF above YES box checked dofetilide contraindicated unless patient has ICD; then may proceed if QTc 500-550 msec or with known ventricular conduction abnormalities may proceed with QTc 550-600 msec. ?QTc = 0.41   ? ?[]   ?[x]   ?Patient is known or suspected to have a digoxin level greater than 2 ng/ml: ?No results found for: DIGOXIN ?   ? ?[]   ?[x]   ?Creatinine clearance less than 20 ml/min (calculated using Cockcroft-Gault, actual body weight and serum creatinine): ?Estimated Creatinine Clearance: 61.1 mL/min (by C-G formula based on SCr of 0.94 mg/dL). ?   ? ?[]   ?[x]  Patient has received drugs known to prolong the QT intervals within the last 48 hours (phenothiazines, tricyclics or tetracyclic antidepressants, erythromycin, H-1 antihistamines, cisapride, fluoroquinolones, azithromycin, ondansetron).  ? ?Patient will chage from lexapro to zoloft for less rrisk QTC prolongation ? ?Updated information on QT prolonging agents is available to be searched on the following database:QT prolonging agents    ? ?[]   ?[x]   ?Patient received a dose of hydrochlorothiazide (Oretic) alone or in any combination including triamterene (Dyazide, Maxzide) in the last 48 hours.   ? ?[]   ?[x]  Patient received a medication known to increase dofetilide plasma concentrations prior to  initial dofetilide dose:  ?Trimethoprim (Primsol, Proloprim) in the last 36 hours ?Verapamil (Calan, Verelan) in the last 36 hours or a sustained release dose in the last 72 hours ?Megestrol (Megace) in the last 5 days  ?Cimetidine (Tagamet) in the last 6 hours ?Ketoconazole (Nizoral) in the last 24 hours ?Itraconazole (Sporanox) in the last 48 hours  ?Prochlorperazine (Compazine) in the last 36 hours ?   ? ?[]   ?[x]   ?Patient is known to have a history of torsades de pointes; congenital or acquired long QT syndromes.   ? ?[]   ?[x]   ?Patient has received a Class 1 antiarrhythmic with less than 2 half-lives since last dose. ?(Disopyramide, Quinidine, Procainamide, Lidocaine, Mexiletine, Flecainide, Propafenone)   ? ?[]   ?[x]   ?Patient has received amiodarone therapy in the past 3 months or amiodarone level is greater than 0.3 ng/ml.   ? ?Patient has been appropriately anticoagulated with Rivaroxaban 20mg  daily. ? ?Labs: ?   ?Component Value Date/Time  ? K 4.2 07/03/2021 1126  ? MG 2.1 07/03/2021 1126  ?  ? ?Plan: ?Potassium: ?K >/= 4: Appropriate to initiate Tikosyn, no replacement needed   ? ?Magnesium: ?Mg >2: Appropriate to initiate Tikosyn, no replacement needed   ? ? ?Bonnita Nasuti Pharm.D. CPP, BCPS ?Clinical Pharmacist ?[phone] ?07/03/2021 2:25 PM  ? ? ?

## 2021-07-03 NOTE — Progress Notes (Addendum)
ANTICOAGULATION CONSULT NOTE - Initial Consult ? ?Pharmacy Consult for IV heparin ?Indication: atrial fibrillation ? ?Allergies  ?Allergen Reactions  ? Sulfamethoxazole-Trimethoprim   ?  Rash ?  ? Sulfonamide Derivatives   ?  rash  ? Cefaclor   ?  Itching w/o rash  ? ? ?Patient Measurements: ?Height: 5\' 3"  (160 cm) ?Weight: 93.5 kg (206 lb 2.1 oz) ?IBW/kg (Calculated) : 52.4 ?Heparin Dosing Weight: 73.9 kg ? ?Vital Signs: ?Temp: 98.3 ?F (36.8 ?C) (03/05 1933) ?Temp Source: Oral (03/05 1933) ?BP: 103/60 (03/06 0145) ?Pulse Rate: 51 (03/06 0145) ? ?Labs: ?Recent Labs  ?  07/02/21 ?1950 07/02/21 ?2218  ?HGB 15.1*  --   ?HCT 44.9  --   ?PLT 263  --   ?CREATININE 0.96  --   ?TROPONINIHS 9 9  ? ? ?Estimated Creatinine Clearance: 59.2 mL/min (by C-G formula based on SCr of 0.96 mg/dL). ? ? ?Medical History: ?Past Medical History:  ?Diagnosis Date  ? CAD (coronary artery disease)   ? Cerebral atrophy (HCC) 02/07/2011  ? done to assess head trauma ( [facility])  ? Colon polyps 2005  ? Depression   ? Hyperglycemia   ? Hyperlipidemia   ? Hypertension   ? Hypothyroidism   ? ? ?Medications:  ?Xarelto 20 mg PO QD w/ supper ? ?Assessment: ?Patient presenting in afib with request for heparin consultation. ? ?Goal of Therapy:  ?Heparin level 0.3-0.7 units/ml ?Monitor platelets by anticoagulation protocol: Yes ?  ?Plan:  ?Start heparin infusion at 1100 units/hr. Start at 1800 on 07/03/21 due to dose of Xarelto 20 mg taken at 1800 on 07/02/21. ?Check anti-Xa level in 8 hours and daily while on heparin ?Continue to monitor H&H and platelets ? ?09/01/21, PharmD Candidate [phone] ?07/03/2021,2:27 AM ? ? ?

## 2021-07-03 NOTE — ED Notes (Signed)
Cardiology paged regarding pt having up to 3 sec. Pauses on the cardiac monitor and now converted to sinus brady on the monitor, rate 48-50. MD requesting for pt to remain off the Cardizem and to apply defibrillation pads for monitoring. Pt remains a/ox4 during these episodes, states she feels dizzy and like a warm sensation throughout her body. Will continue to monitor.  ?

## 2021-07-03 NOTE — Care Management (Signed)
07-03-21 1146 Patient presented for Tikosyn load. Benefits check submitted for cost. Case Manager will discuss cost and pharmacy of choice with the patient as she progresses.  ?

## 2021-07-03 NOTE — H&P (Signed)
Cardiology Admission History and Physical:   Patient ID: Kathleen Sellers MRN: [id number]; DOB: 1950/11/27   Admission date: 07/02/2021  PCP:  Hendricks Limes, MD   [facility] Providers Cardiologist:  None        Chief Complaint: Palpitations  Patient Profile:   Kathleen Sellers is a 71 y.o. female with paroxysmal A-fib, HTN, HLD, CAD, and hypothyroidism who is being seen 07/03/2021 for the evaluation of atrial fibrillation.  She was incidentally found to have multiple sinus pauses.  History of Present Illness:   Kathleen Sellers has been to the ED 3 times now since 2/14 after developing atrial fibrillation with RVR for the first time.  Each time she presented to the ED she would receive diltiazem and converted to NSR spontaneously.  She is currently on diltiazem 30 mg twice daily for rate control and Xarelto for [place].  She reports good adherence to both these medications.  Yesterday however, the patient was outside mowing the grass when she did developed acute diarrhea and soiled herself.  2 hours later she developed pain in her back between her shoulder blades, SOB and palpitations.  These were symptoms that she is experienced before with her prior A-fib episodes so she presented to the ED for evaluation.  Of note the patient does endorse having an episode of lightheadedness recently where she felt like she was going to pass out but did not.  This was in the absence of diltiazem use.  She denies tobacco, EtOH, illicit drug use.  No family history of atrial fibrillation  In the ED her VS were afebrile, HR 110 - 160s, BP 137/117 satting 97% on room air.  Labs were unremarkable.  EKG showed atrial fibrillation with RVR.  She received IV diltiazem x1 followed by diltiazem gtt. During her ED course the patient developed multiple sinus pauses longest of which was 5.4 seconds.  I discontinued her Dilt gtt. upon seeing the sinus pauses, but they persisted.  She converted to sinus bradycardia on her  own.  She is admitted to cardiology for further work-up.   Past Medical History:  Diagnosis Date   CAD (coronary artery disease)    Cerebral atrophy ([place]) 02/07/2011   done to assess head trauma ( [facility])   Colon polyps 2005   Depression    Hyperglycemia    Hyperlipidemia    Hypertension    Hypothyroidism     Past Surgical History:  Procedure Laterality Date   APPENDECTOMY  1970   with USO   CARDIAC CATHETERIZATION  2003   CATARACT EXTRACTION     Dr Gershon Crane   CHOLECYSTECTOMY  2005   COLONOSCOPY W/ POLYPECTOMY  2006   Dr. Penelope Coop   Hysterectomy & USO  1995   NASAL SINUS SURGERY  1988   Dr. Ernesto Rutherford   OOPHORECTOMY  1970   benign tumor     Medications Prior to Admission: Prior to Admission medications   Medication Sig Start Date End Date Taking? Authorizing Provider  diltiazem (CARDIZEM) 30 MG tablet Take 1 tablet (30 mg total) by mouth 2 (two) times daily. 06/17/21  Yes Valarie Merino, MD  escitalopram (LEXAPRO) 20 MG tablet Take 20 mg by mouth daily.     Yes [provider]  LORazepam (ATIVAN) 0.5 MG tablet Take 0.5 mg by mouth daily. 05/08/21  Yes [provider]  omeprazole (PRILOSEC) 40 MG capsule Take 40 mg by mouth daily. 05/29/21  Yes [provider]  rivaroxaban (XARELTO) 20 MG  TABS tablet Take 1 tablet (20 mg total) by mouth daily with supper. 06/13/21  Yes Lucrezia Starch, MD     Allergies:    Allergies  Allergen Reactions   Sulfamethoxazole-Trimethoprim     Rash    Sulfonamide Derivatives     rash   Cefaclor     Itching w/o rash    Social History:   Social History   Socioeconomic History   Marital status: Married    Spouse name: Not on file   Number of children: 3   Years of education: Not on file   Highest education level: Not on file  Occupational History   Occupation: Receptionist - Scientist, research (life sciences) Pediatrics    Employer: [employer]  Tobacco Use   Smoking status: Never   Smokeless tobacco: Never   Substance and Sexual Activity   Alcohol use: No   Drug use: No   Sexual activity: Not on file  Other Topics Concern   Not on file  Social History Narrative   No reg exercise   Social Determinants of Health   Financial Resource Strain: Not on file  Food Insecurity: Not on file  Transportation Needs: Not on file  Physical Activity: Not on file  Stress: Not on file  Social Connections: Not on file  Intimate Partner Violence: Not on file    Family History:   The patient's family history includes Breast cancer in her maternal grandmother; Coronary artery disease in her paternal uncle; Diabetes in her mother; Heart attack in her paternal grandfather; Hypertension in her mother; Lung cancer in her mother; Stroke in her father.    ROS:  Please see the history of present illness.  All other ROS reviewed and negative.     Physical Exam/Data:   Vitals:   07/02/21 2345 07/03/21 0000 07/03/21 0015 07/03/21 0030  BP: 110/82 119/87 (!) 115/59 (!) 108/59  Pulse: 75 93 65 72  Resp: 12 10 14 11   Temp:      TempSrc:      SpO2: 98% 96% 97% 97%    Intake/Output Summary (Last 24 hours) at 07/03/2021 0038 Last data filed at 07/03/2021 0036 Gross per 24 hour  Intake 1038.55 ml  Output --  Net 1038.55 ml   Last 3 Weights 06/27/2021 03/19/2014 09/16/2013  Weight (lbs) 206 lb 3.2 oz 175 lb 12.8 oz 173 lb  Weight (kg) 93.532 kg 79.742 kg 78.472 kg     There is no height or weight on file to calculate BMI.  General:  Well nourished, well developed, in no acute distress, very pleasant HEENT: Atraumatic, normocephalic, MMM Neck: no JVD Vascular: No carotid bruits; Distal pulses 2+ bilaterally   Cardiac: Tachycardic with irregularly irregular rhythm, normal S1/2, no M/G/R Lungs:  clear to auscultation bilaterally, no wheezing, rhonchi or rales  Abd: soft, nontender, no hepatomegaly  Ext: no edema, WWP Musculoskeletal:  No deformities, BUE and BLE strength normal and equal Skin: warm and dry   Neuro:  CNs 2-12 intact, no focal abnormalities noted Psych:  Normal affect    EKG:  The ECG that was done  was personally reviewed and demonstrates atrial fibrillation with RVR.  Subsequent EKG shows intermittent sinus pauses.         Relevant CV Studies:  None  Laboratory Data:  High Sensitivity Troponin:   Recent Labs  Lab 06/13/21 1158 06/13/21 1405 07/02/21 1950 07/02/21 2218  TROPONINIHS 6 9 9 9       Chemistry Recent Labs  Lab 07/02/21 1950  NA 137  K 4.1  CL 106  CO2 20*  GLUCOSE 111*  BUN 20  CREATININE 0.96  CALCIUM 9.2  GFRNONAA >60  ANIONGAP 11    No results for input(s): PROT, ALBUMIN, AST, ALT, ALKPHOS, BILITOT in the last 168 hours. Lipids No results for input(s): CHOL, TRIG, HDL, LABVLDL, LDLCALC, CHOLHDL in the last 168 hours. Hematology Recent Labs  Lab 07/02/21 1950  WBC 6.6  RBC 5.21*  HGB 15.1*  HCT 44.9  MCV 86.2  MCH 29.0  MCHC 33.6  RDW 13.5  PLT 263   Thyroid No results for input(s): TSH, FREET4 in the last 168 hours. BNPNo results for input(s): BNP, PROBNP in the last 168 hours.  DDimer No results for input(s): DDIMER in the last 168 hours.   Radiology/Studies:  No results found.   Assessment and Plan:   Kathleen Sellers is a 71 y.o. female with paroxysmal A-fib, HTN, HLD, CAD, and hypothyroidism who is being seen 07/03/2021 for the evaluation of atrial fibrillation.  She was incidentally found to have multiple sinus pauses.  #Tachy-Brady Syndrome #Sinus Pauses :: Patient initially presented with atrial fibrillation with RVR which is a new diagnosis for her.  The past month.  She ultimately converted to sinus bradycardia on her own; however, she has since developed frequent sinus pauses which are associated with presyncope.  These occurred even after discontinuing her diltiazem.  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  She needs rate control; however, it really is not safe to continue  using diltiazem or initiating metoprolol in light of her sinus pauses.  Will speak to EP to see if a pacemaker is indicated.  We will also get a TTE to evaluate for any structural heart disease.  I will start heparin instead of continuing her Xarelto in the event that a PPM is needed. -Converted to sinus bradycardia -Hold home diltiazem in the setting of sinus pauses -Speak with EP about possible need for PPM -Check TSH -Hold home Xarelto and start heparin pending possible procedure -Maintain telemetry  #HTN #HLD -Not currently taking anything for these reported diagnoses  #Hypothyroidism -Not on Synthroid for reasons that I am not sure.  Will inquire more -TSH  #OSA not on CPAP -Is open to trialing 1 now given her recent health scares  Risk Assessment/Risk Scores:         CHA2DS2-VASc Score = 4   This indicates a 4.8% annual risk of stroke. The patient's score is based upon: CHF History: 0 HTN History: 1 Diabetes History: 0 Stroke History: 0 Vascular Disease History: 1 Age Score: 1 Gender Score: 1      Severity of Illness: The appropriate patient status for this patient is INPATIENT. Inpatient status is judged to be reasonable and necessary in order to provide the required intensity of service to ensure the patient's safety. The patient's presenting symptoms, physical exam findings, and initial radiographic and laboratory data in the context of their chronic comorbidities is felt to place them at high risk for further clinical deterioration. Furthermore, it is not anticipated that the patient will be medically stable for discharge from the hospital within 2 midnights of admission.   * I certify that at the point of admission it is my clinical judgment that the patient will require inpatient hospital care spanning beyond 2 midnights from the point of admission due to high intensity of service, high risk for further deterioration and high frequency of surveillance required.*  For questions or updates, please contact [place] Please consult [email or web address] for contact info under     Signed, Hershal Coria, MD  07/03/2021 12:38 AM

## 2021-07-03 NOTE — Consult Note (Addendum)
ELECTROPHYSIOLOGY CONSULT NOTE    Patient ID: LA BROZYNA MRN: [id number], DOB/AGE: 1950-05-04 71 y.o.  Admit date: 07/02/2021 Date of Consult: 07/03/2021  Primary Physician: Hendricks Limes, MD Primary Cardiologist: None  Electrophysiologist:  New  Referring Provider: Dr. Conley Canal  Patient Profile: Kathleen Sellers is a 71 y.o. female with a history of PAF, HTN, HLD, CAD, and hypothyroidism who is being seen today for the evaluation of AF with post conversion pauses at the request of Dr. Conley Canal who saw the patient overnight.   HPI:  Kathleen Sellers is a 71 y.o. female with medical history as above.   Ms. Stegenga has been to the ED 3 times now since 2/14 after developing atrial fibrillation with RVR for the first time.  Each time she presented to the ED she would receive diltiazem and converted to NSR spontaneously.  She is currently on diltiazem 30 mg twice daily for rate control and Xarelto for [place].  She reports good adherence to both these medications.  Yesterday however, the patient was outside mowing the grass when she developed acute diarrhea and soiled herself.  2 hours later she developed pain in her back between her shoulder blades, SOB and palpitations.  These were symptoms that she had experienced before with her prior A-fib episodes so she presented to the ED for evaluation.  Of note the patient does endorse having an episode of lightheadedness recently where she felt like she was going to pass out but did not.  This was in the absence of diltiazem use.  In the ED her VS were afebrile, HR 110 - 160s, BP 137/117 satting 97% on room air.  Labs were unremarkable.  EKG showed atrial fibrillation with RVR.  She received IV diltiazem x1 followed by diltiazem gtt. During her ED course the patient developed multiple sinus pauses longest of which was 5.4 seconds. Personal review appears to show multiple post conversion pauses as her rhythm went in and out of AF within a couple of  minutes multiple times. Her dilt gtt was stopped with concern for her bradycardia.   She was admitted to cardiology for further work-up. EP asked to see today for definitive management of her AF / ? Tachy-brady up to and including PPM consideration.   She is doing OK last night. She does report near syncope (feeling hot and flushed and like she was going to "pass out") with one of the longer pauses last night. States her subscapular pain has occurred only with AF RVR. Otherwise no chest pain. No frank syncope.   Past Medical History:  Diagnosis Date   CAD (coronary artery disease)    Cerebral atrophy ([place]) 02/07/2011   done to assess head trauma ( [facility])   Colon polyps 2005   Depression    Hyperglycemia    Hyperlipidemia    Hypertension    Hypothyroidism      Surgical History:  Past Surgical History:  Procedure Laterality Date   APPENDECTOMY  1970   with USO   CARDIAC CATHETERIZATION  2003   CATARACT EXTRACTION     Dr Gershon Crane   CHOLECYSTECTOMY  2005   COLONOSCOPY W/ POLYPECTOMY  2006   Dr. Penelope Coop   Hysterectomy & USO  1995   NASAL SINUS SURGERY  1988   Dr. Ernesto Rutherford   OOPHORECTOMY  1970   benign tumor     Medications Prior to Admission  Medication Sig Dispense Refill Last Dose   diltiazem (CARDIZEM) 30 MG tablet Take  1 tablet (30 mg total) by mouth 2 (two) times daily. 60 tablet 0 07/02/2021   escitalopram (LEXAPRO) 20 MG tablet Take 20 mg by mouth daily.     07/02/2021   LORazepam (ATIVAN) 0.5 MG tablet Take 0.5 mg by mouth daily.   07/02/2021   omeprazole (PRILOSEC) 40 MG capsule Take 40 mg by mouth daily.   07/02/2021   rivaroxaban (XARELTO) 20 MG TABS tablet Take 1 tablet (20 mg total) by mouth daily with supper. 30 tablet 0 07/02/2021 at 1800    Inpatient Medications:   escitalopram  20 mg Oral Daily   LORazepam  0.5 mg Oral Daily   pantoprazole  40 mg Oral Daily    Allergies:  Allergies  Allergen Reactions   Sulfamethoxazole-Trimethoprim     Rash     Sulfonamide Derivatives     rash   Cefaclor     Itching w/o rash    Social History   Socioeconomic History   Marital status: Married    Spouse name: Not on file   Number of children: 3   Years of education: Not on file   Highest education level: Not on file  Occupational History   Occupation: [employer]    Employer: [employer]  Tobacco Use   Smoking status: Never   Smokeless tobacco: Never  Substance and Sexual Activity   Alcohol use: No   Drug use: No   Sexual activity: Not on file  Other Topics Concern   Not on file  Social History Narrative   No reg exercise   Social Determinants of Health   Financial Resource Strain: Not on file  Food Insecurity: Not on file  Transportation Needs: Not on file  Physical Activity: Not on file  Stress: Not on file  Social Connections: Not on file  Intimate Partner Violence: Not on file     Family History  Problem Relation Age of Onset   Stroke Father        cerebral hemorrhage   Diabetes Mother    Hypertension Mother    Lung cancer Mother        with bone metastases   Coronary artery disease Paternal Uncle    Breast cancer Maternal Grandmother    Heart attack Paternal Grandfather        in 40s     Review of Systems: All other systems reviewed and are otherwise negative except as noted above.  Physical Exam: Vitals:   07/03/21 0300 07/03/21 0343 07/03/21 0518 07/03/21 0812  BP: 102/62 111/66 (!) 147/68 117/67  Pulse: (!) 52 94 70 (!) 58  Resp: 16 16  16   Temp: 98.5 F (36.9 C) (!) 97.5 F (36.4 C) 98.3 F (36.8 C) 98 F (36.7 C)  TempSrc: Oral Oral Oral Oral  SpO2: 92% 94% 100% 93%  Weight:  91.6 kg    Height:  5\' 4"  (1.626 m)      GEN- The patient is well appearing, alert and oriented x 3 today.   HEENT: normocephalic, atraumatic; sclera clear, conjunctiva pink; hearing intact; oropharynx clear; neck supple Lungs- Clear to ausculation bilaterally, normal work of breathing.  No  wheezes, rales, rhonchi Heart- Regular rate and rhythm, no murmurs, rubs or gallops GI- soft, non-tender, non-distended, bowel sounds present Extremities- no clubbing, cyanosis, or edema; DP/PT/radial pulses 2+ bilaterally MS- no significant deformity or atrophy Skin- warm and dry, no rash or lesion Psych- euthymic mood, full affect Neuro- strength and sensation are intact  Labs:  Lab Results  Component Value Date   WBC 6.6 07/02/2021   HGB 15.1 (H) 07/02/2021   HCT 44.9 07/02/2021   MCV 86.2 07/02/2021   PLT 263 07/02/2021    Recent Labs  Lab 07/03/21 0428  NA 139  K 4.0  CL 108  CO2 24  BUN 18  CREATININE 1.02*  CALCIUM 8.8*  GLUCOSE 111*      Radiology/Studies: DG Chest 1 View  Result Date: 06/17/2021 CLINICAL DATA:  Chest pain.  History of atrial fibrillation. EXAM: CHEST  1 VIEW COMPARISON:  06/13/2021 FINDINGS: Heart size and mediastinal contours are unremarkable. No pleural effusion or edema identified. No airspace densities. IMPRESSION: No acute cardiopulmonary abnormalities. Electronically Signed   By: Kerby Moors M.D.   On: 06/17/2021 06:44   DG Chest 2 View  Result Date: 06/13/2021 CLINICAL DATA:  Chest pain EXAM: CHEST - 2 VIEW COMPARISON:  06/28/2010 FINDINGS: At least 1 upper lumbar compression deformity, suboptimally evaluated. Mild right hemidiaphragm elevation. Midline trachea. Normal heart size. Atherosclerosis in the transverse aorta. No pleural effusion or pneumothorax. Clear lungs. IMPRESSION: No acute cardiopulmonary disease. At least 1 upper lumbar compression deformity, new since 2012. Aortic Atherosclerosis (ICD10-I70.0). Electronically Signed   By: Abigail Miyamoto M.D.   On: 06/13/2021 11:25   DG CHEST PORT 1 VIEW  Result Date: 07/03/2021 CLINICAL DATA:  Atrial fibrillation with rapid ventricular response. EXAM: PORTABLE CHEST 1 VIEW COMPARISON:  June 17, 2021 FINDINGS: Multiple overlying radiopaque cardiac lead wires and tags are noted. The  heart size and mediastinal contours are within normal limits. Both lungs are clear. The visualized skeletal structures are unremarkable. IMPRESSION: No acute cardiopulmonary disease. Electronically Signed   By: Virgina Norfolk M.D.   On: 07/03/2021 03:22    [id number] am with sinus bradycardia at 48 bpm (personally reviewed)  EKG on arrival with AF RVR at 145 bpm  TELEMETRY: Sinus brady / NSR 40-60s since last night; Prior to that had AF RVR with numerous post conversion pauses, some with sinus beats, some straight back into AF, at least one with near syncope.  (personally reviewed)  Assessment/Plan: 1. Paroxysmal Atrial Fibrillation (ICD10:  I48.0) The patient's CHA2DS2-VASc score is 4, indicating a 4.8% annual risk of stroke.   Suspect her afib is related to untreated OSA, obesity, and HTN.  She last took Xarelto yesterday evening, and is scheduled to start heparin tonight. Suspect can defer this.  Echocardiogram pending which will further guide therapies.  Diltiazem currently on hold with multiple post conversion pauses. Long discussion with patient and daughter over phone re: possible treatments as AAD alone (likely tikosyn or amiodarone) +/- ablation consideration.  I think we would try this prior to pacing. Pt and daughter to discuss.  Dr. Quentin Ore to see.  CrCl with actual weight is 74. Will start tikosyn at 500 mcg and titrate down as needed.    2. Post conversion pauses Does not appear to have had any pauses other than post conversion; One option will be to use AAD to keep in NSR and follow.   3. Obesity Body mass index is 34.67 kg/m.  Lifestyle modification was discussed at length including regular exercise and weight reduction.   4. Obstructive sleep apnea Pending repeat sleep study.    5. CAD Nonobstructive on LHC in 2003 Does have chest heaviness/back pain when in RVR. Suspect most likely demand ischemia.    6. HTN Continue current regimen.   She is NPO for now. Dr.  Quentin Ore to see  for disposition and planning.   ADDENDUM  Dr. Quentin Ore has seen and we plan Tikosyn initiation today with ablation consideration as outpatient.   For questions or updates, please contact [place] Please consult [email or web address] for contact info under Cardiology/STEMI.  Jacalyn Lefevre, PA-C  07/03/2021 8:55 AM

## 2021-07-03 NOTE — ED Notes (Signed)
Attempted to call report, RN unavailable, Will call back in 10-15 minutes.  ?

## 2021-07-03 NOTE — Progress Notes (Signed)
Post Tikosyn EKG shows Qtc 486. Will continue to monitor. Jessie Foot, RN  ?

## 2021-07-04 LAB — CBC
HCT: 39.2 % (ref 36.0–46.0)
Hemoglobin: 12.5 g/dL (ref 12.0–15.0)
MCH: 28.2 pg (ref 26.0–34.0)
MCHC: 31.9 g/dL (ref 30.0–36.0)
MCV: 88.3 fL (ref 80.0–100.0)
Platelets: 192 10*3/uL (ref 150–400)
RBC: 4.44 MIL/uL (ref 3.87–5.11)
RDW: 13.8 % (ref 11.5–15.5)
WBC: 4.2 10*3/uL (ref 4.0–10.5)
nRBC: 0 % (ref 0.0–0.2)

## 2021-07-04 LAB — BASIC METABOLIC PANEL
Anion gap: 7 (ref 5–15)
BUN: 24 mg/dL — ABNORMAL HIGH (ref 8–23)
CO2: 24 mmol/L (ref 22–32)
Calcium: 8.8 mg/dL — ABNORMAL LOW (ref 8.9–10.3)
Chloride: 107 mmol/L (ref 98–111)
Creatinine, Ser: 1.27 mg/dL — ABNORMAL HIGH (ref 0.44–1.00)
GFR, Estimated: 45 mL/min — ABNORMAL LOW (ref >60)
Glucose, Bld: 121 mg/dL — ABNORMAL HIGH (ref 70–99)
Potassium: 3.8 mmol/L (ref 3.5–5.1)
Sodium: 138 mmol/L (ref 135–145)

## 2021-07-04 LAB — HEPARIN LEVEL (UNFRACTIONATED): Heparin Unfractionated: >1.1 IU/mL — ABNORMAL HIGH (ref 0.30–0.70)

## 2021-07-04 LAB — APTT: aPTT: 35 seconds (ref 24–36)

## 2021-07-04 LAB — TROPONIN I (HIGH SENSITIVITY): Troponin I (High Sensitivity): 5 ng/L (ref <18)

## 2021-07-04 LAB — MAGNESIUM: Magnesium: 2 mg/dL (ref 1.7–2.4)

## 2021-07-04 MED ORDER — NITROGLYCERIN 0.4 MG SL SUBL
SUBLINGUAL_TABLET | SUBLINGUAL | Status: AC
Start: 2021-07-04 — End: 2021-07-04
  Filled 2021-07-04: qty 1

## 2021-07-04 MED ORDER — MAGNESIUM SULFATE 2 GM/50ML IV SOLN
2.0000 g | Freq: Once | INTRAVENOUS | Status: AC
  Administered 2021-07-04: 2 g via INTRAVENOUS
  Filled 2021-07-04: qty 50

## 2021-07-04 MED ORDER — NITROGLYCERIN 0.4 MG SL SUBL
0.4000 mg | SUBLINGUAL_TABLET | SUBLINGUAL | Status: DC | PRN
  Administered 2021-07-04 (×3): 0.4 mg via SUBLINGUAL
  Filled 2021-07-04 (×2): qty 1

## 2021-07-04 MED ORDER — POTASSIUM CHLORIDE CRYS ER 20 MEQ PO TBCR
40.0000 meq | EXTENDED_RELEASE_TABLET | Freq: Once | ORAL | Status: AC
  Administered 2021-07-04: 40 meq via ORAL
  Filled 2021-07-04: qty 2

## 2021-07-04 NOTE — Progress Notes (Signed)
Contacted pharmacy in regards to pts K+ level. Level was last drawn at 0200 3/7 and was 3.8. Pt was supplemented with 40 meq kcl at 0820 but labs were not redrawn. Per pharmacist pt is okay to receive tonights dose of Tikosyn and recheck labs in AM. Will continue to monitor.  ?

## 2021-07-04 NOTE — Progress Notes (Signed)
Pharmacy: Dofetilide (Tikosyn) - Follow Up ?Assessment and Electrolyte Replacement ? ?Pharmacy consulted to assist in monitoring and replacing electrolytes in this 71 y.o. female admitted on 07/02/2021 undergoing dofetilide initiation. First dofetilide dose: 500 mcg BID - 1st dose given 3/6 @ 8 pm ? ?Labs: ?   ?Component Value Date/Time  ? K 3.8 07/04/2021 0235  ? MG 2.0 07/04/2021 0235  ?  ? ?Plan: ?Potassium: ?K 3.8-3.9:  Give KCl 40 mEq po x1  ? ?Magnesium: ?Mg 1.8-2: Give Mg 2 gm IV x1  ? ? ? ? ?Thank you for allowing pharmacy to participate in this patient's care  ? ?Reece Leader, Pharm D, BCPS, BCCP ?Clinical Pharmacist ? 07/04/2021 7:22 AM  ? ?[facility] pharmacy phone numbers are listed on [email or web address] ? ?

## 2021-07-04 NOTE — Progress Notes (Signed)
Morning EKG reviewed   ? ?Shows remains in NSR  ? ?Terminal portion of T wave is consistent with U wave. Reviewed with Dr. Quentin Ore.  ? ?QTc stable ~480-490. Will continue Tikosyn 500 mcg BID.  ? ?Pt will not require DCCV  ? ?Shirley Friar, PA-C  ?Pager: [phone]  ?07/04/2021 2:56 PM  ? ?

## 2021-07-04 NOTE — Progress Notes (Signed)
Patient notified RN of pain in left shoulder.  States "this is the pain I have before I go into afib". States sharp pain, denies sob, radiation. Skin warm and dry. Rates 2-3/10.  BP 151/77, HR 63. EKG done.  Patient initially refused NTG sl but then agreed. Given NTG SL x 2 with decrease in pain to 0-1/10.  PA [facility] notified.  Troponins ordered.   ?

## 2021-07-04 NOTE — Progress Notes (Signed)
RN called stating pt complaining of left shoulder pain, unlike prior angina pain, but feels like she is "about to go into Afib." RN gave one nitro SL which improved the left shoulder pain that was initially descried as a 3/10. EKG repeated and reviewed. She has nonobstructive disease by heart cath in 2003. Will collect 2 troponin to rule out. ?

## 2021-07-04 NOTE — Progress Notes (Addendum)
Electrophysiology Rounding Note  Patient Name: Kathleen Sellers Date of Encounter: 07/04/2021  Primary Cardiologist: None  Electrophysiologist: Dr. Quentin Ore   Subjective   Pt  remains on NSR on Tikosyn 500 mcg BID   QTc from EKG last pm shows stable QTc at ~480  The patient is doing well today.  At this time, the patient denies chest pain, shortness of breath, or any new concerns.  Inpatient Medications    Scheduled Meds:  dofetilide  500 mcg Oral BID   LORazepam  0.5 mg Oral Daily   pantoprazole  40 mg Oral Daily   potassium chloride  40 mEq Oral Once   rivaroxaban  20 mg Oral Q supper   sertraline  50 mg Oral Daily   sodium chloride flush  3 mL Intravenous Q12H   Continuous Infusions:  sodium chloride     magnesium sulfate bolus IVPB     PRN Meds: sodium chloride, acetaminophen, sodium chloride flush   Vital Signs    Vitals:   07/03/21 2058 07/04/21 0036 07/04/21 0300 07/04/21 0348  BP:  129/69  133/74  Pulse:  (!) 55 (!) 53 (!) 53  Resp:  12  17  Temp: 98 F (36.7 C) 97.9 F (36.6 C)  98.5 F (36.9 C)  TempSrc: Oral Oral  Oral  SpO2: 100% 97% 98% 94%  Weight:      Height:       No intake or output data in the 24 hours ending 07/04/21 0731 Filed Weights   07/03/21 0200 07/03/21 0343  Weight: 93.5 kg 91.6 kg    Physical Exam    GEN- The patient is well appearing, alert and oriented x 3 today.   Head- normocephalic, atraumatic Eyes-  Sclera clear, conjunctiva pink Ears- hearing intact Oropharynx- clear Neck- supple Lungs- Clear to ausculation bilaterally, normal work of breathing Heart- Regular rate and rhythm, no murmurs, rubs or gallops GI- soft, NT, ND, + BS Extremities- no clubbing, cyanosis, or edema Skin- no rash or lesion Psych- euthymic mood, full affect Neuro- strength and sensation are intact  Labs    CBC Recent Labs    07/02/21 1950 07/04/21 0235  WBC 6.6 4.2  HGB 15.1* 12.5  HCT 44.9 39.2  MCV 86.2 88.3  PLT 263 [id number]    Basic Metabolic Panel Recent Labs    07/03/21 1126 07/04/21 0235  NA 137 138  K 4.2 3.8  CL 106 107  CO2 25 24  GLUCOSE 93 121*  BUN 16 24*  CREATININE 0.94 1.27*  CALCIUM 8.9 8.8*  MG 2.1 2.0    Potassium  Date/Time Value Ref Range Status  07/04/2021 02:35 AM 3.8 3.5 - 5.1 mmol/L Final   Magnesium  Date/Time Value Ref Range Status  07/04/2021 02:35 AM 2.0 1.7 - 2.4 mg/dL Final    Comment:    Performed at [facility] [address] [place], [place] [address]    Telemetry    NSR 60-70s (personally reviewed)  Radiology    DG CHEST PORT 1 VIEW  Result Date: 07/03/2021 CLINICAL DATA:  Atrial fibrillation with rapid ventricular response. EXAM: PORTABLE CHEST 1 VIEW COMPARISON:  June 17, 2021 FINDINGS: Multiple overlying radiopaque cardiac lead wires and tags are noted. The heart size and mediastinal contours are within normal limits. Both lungs are clear. The visualized skeletal structures are unremarkable. IMPRESSION: No acute cardiopulmonary disease. Electronically Signed   By: Virgina Norfolk M.D.   On: 07/03/2021 03:22   ECHOCARDIOGRAM COMPLETE  Result Date: 07/03/2021    ECHOCARDIOGRAM REPORT   Patient Name:   Kathleen Sellers Date of Exam: 07/03/2021 Medical Rec #:  [id number]        Height:       64.0 in Accession #:    [id number]       Weight:       202.0 lb Date of Birth:  [birth year]        BSA:          1.965 m Patient Age:    71 years         BP:           111/66 mmHg Patient Gender: F                HR:           55 bpm. Exam Location:  Inpatient Procedure: 2D Echo, Cardiac Doppler and Color Doppler Indications:    Afib  History:        Patient has no prior history of Echocardiogram examinations.                 CAD; Risk Factors:Hypertension.  Sonographer:    Jyl Heinz Referring Phys: [id number] [place]  1. Left ventricular ejection fraction, by estimation, is 60 to 65%. The left ventricle has normal function. The left ventricle has no  regional wall motion abnormalities. Severe focal basal septal left ventricular hypertrophy. No significant LV outflow tract gradient noted, no mitral valve systolic anterior motion noted. Left ventricular diastolic parameters are consistent with Grade I diastolic dysfunction (impaired relaxation).  2. Right ventricular systolic function is normal. The right ventricular size is normal. There is normal pulmonary artery systolic pressure. The estimated right ventricular systolic pressure is [id number] mmHg.  3. The mitral valve is normal in structure. Trivial mitral valve regurgitation. No evidence of mitral stenosis.  4. The aortic valve is tricuspid. There is mild calcification of the aortic valve. Aortic valve regurgitation is trivial. Aortic valve sclerosis/calcification is present, without any evidence of aortic stenosis.  5. The inferior vena cava is normal in size with greater than 50% respiratory variability, suggesting right atrial pressure of 3 mmHg. FINDINGS  Left Ventricle: Left ventricular ejection fraction, by estimation, is 60 to 65%. The left ventricle has normal function. The left ventricle has no regional wall motion abnormalities. The left ventricular internal cavity size was normal in size. Severe focal basal septal left ventricular hypertrophy. Left ventricular diastolic parameters are consistent with Grade I diastolic dysfunction (impaired relaxation). Right Ventricle: The right ventricular size is normal. No increase in right ventricular wall thickness. Right ventricular systolic function is normal. There is normal pulmonary artery systolic pressure. The tricuspid regurgitant velocity is 2.37 m/s, and  with an assumed right atrial pressure of 3 mmHg, the estimated right ventricular systolic pressure is [id number] mmHg. Left Atrium: Left atrial size was normal in size. Right Atrium: Right atrial size was normal in size. Pericardium: There is no evidence of pericardial effusion. Mitral Valve: The mitral valve  is normal in structure. There is mild calcification of the mitral valve leaflet(s). Trivial mitral valve regurgitation. No evidence of mitral valve stenosis. Tricuspid Valve: The tricuspid valve is normal in structure. Tricuspid valve regurgitation is trivial. Aortic Valve: The aortic valve is tricuspid. There is mild calcification of the aortic valve. Aortic valve regurgitation is trivial. Aortic regurgitation PHT measures 691 msec. Aortic valve sclerosis/calcification is present, without any evidence of aortic stenosis. Aortic valve peak gradient  measures 10.6 mmHg. Pulmonic Valve: The pulmonic valve was normal in structure. Pulmonic valve regurgitation is trivial. Aorta: The aortic root is normal in size and structure. Venous: The inferior vena cava is normal in size with greater than 50% respiratory variability, suggesting right atrial pressure of 3 mmHg. IAS/Shunts: No atrial level shunt detected by color flow Doppler.  LEFT VENTRICLE PLAX 2D LVIDd:         4.90 cm     Diastology LVIDs:         3.00 cm     LV e' medial:    5.55 cm/s LV PW:         1.10 cm     LV E/e' medial:  13.0 LV IVS:        1.20 cm     LV e' lateral:   6.31 cm/s LVOT diam:     2.00 cm     LV E/e' lateral: 11.4 LV SV:         122 LV SV Index:   62 LVOT Area:     3.14 cm  LV Volumes (MOD) LV vol d, MOD A2C: 92.3 ml LV vol d, MOD A4C: 97.7 ml LV vol s, MOD A2C: 38.2 ml LV vol s, MOD A4C: 40.8 ml LV SV MOD A2C:     54.1 ml LV SV MOD A4C:     97.7 ml LV SV MOD BP:      57.4 ml RIGHT VENTRICLE             IVC RV Basal diam:  3.40 cm     IVC diam: 1.30 cm RV Mid diam:    2.60 cm RV S prime:     11.30 cm/s TAPSE (M-mode): 2.6 cm LEFT ATRIUM             Index        RIGHT ATRIUM           Index LA diam:        4.00 cm 2.04 cm/m   RA Area:     11.30 cm LA Vol (A2C):   45.7 ml 23.26 ml/m  RA Volume:   25.80 ml  13.13 ml/m LA Vol (A4C):   46.0 ml 23.41 ml/m LA Biplane Vol: 45.7 ml 23.26 ml/m  AORTIC VALVE AV Area (Vmax): 2.91 cm AV Vmax:         163.00 cm/s AV Peak Grad:   10.6 mmHg LVOT Vmax:      151.00 cm/s LVOT Vmean:     111.000 cm/s LVOT VTI:       0.388 m AI PHT:         691 msec  AORTA Ao Root diam: 3.10 cm Ao Asc diam:  3.00 cm MITRAL VALVE               TRICUSPID VALVE MV Area (PHT): 3.24 cm    TR Peak grad:   22.5 mmHg MV Decel Time: 234 msec    TR Vmax:        237.00 cm/s MV E velocity: 72.00 cm/s MV A velocity: 75.80 cm/s  SHUNTS MV E/A ratio:  0.95        Systemic VTI:  0.39 m                            Systemic Diam: 2.00 cm Dalton McleanMD Electronically signed by Franki Monte Signature Date/Time: 07/03/2021/3:32:36 PM    Final  Patient Profile     RASHEL FINKLEY is a 71 y.o. female with a past medical history significant for persistent atrial fibrillation.  They were admitted for tikosyn load.   Assessment & Plan    Persistent atrial fibrillation Pt  remains in NSR  on Tikosyn 500 mcg BID  Continue Xarelto K 3.8, Mg 2.0. Supp ordered CHA2DS2VASC is at least 4.  With mild Cr bump CrCL 59.6 this am. Discussed with Pharm D and will continue dose of 500 mcg this am and follow closely.  EF 60-65%. Mild Basal-Septal Hypertrophy noted  2. Post conversion pauses Goal is to keep in NSR with tikosyn, and likely eventual ablation.  No further pauses or brady maintaining sinus.   3. OSA Have stressed the importance of following up for her sleep study and having her sleep apnea treated.   Potentially home Thursday if QT remains stable.   For questions or updates, please contact [place] Please consult [email or web address] for contact info under Cardiology/STEMI.  Signed, Shirley Friar, PA-C  07/04/2021, 7:31 AM

## 2021-07-05 ENCOUNTER — Other Ambulatory Visit: Payer: Self-pay | Admitting: Cardiology

## 2021-07-05 ENCOUNTER — Other Ambulatory Visit (HOSPITAL_COMMUNITY): Payer: Self-pay

## 2021-07-05 ENCOUNTER — Inpatient Hospital Stay (HOSPITAL_COMMUNITY): Payer: PRIVATE HEALTH INSURANCE

## 2021-07-05 DIAGNOSIS — R931 Abnormal findings on diagnostic imaging of heart and coronary circulation: Secondary | ICD-10-CM

## 2021-07-05 DIAGNOSIS — I2510 Atherosclerotic heart disease of native coronary artery without angina pectoris: Secondary | ICD-10-CM

## 2021-07-05 LAB — MAGNESIUM: Magnesium: 2.2 mg/dL (ref 1.7–2.4)

## 2021-07-05 LAB — TROPONIN I (HIGH SENSITIVITY)
Troponin I (High Sensitivity): 4 ng/L (ref <18)
Troponin I (High Sensitivity): 4 ng/L (ref <18)

## 2021-07-05 LAB — BASIC METABOLIC PANEL
Anion gap: 5 (ref 5–15)
BUN: 21 mg/dL (ref 8–23)
CO2: 25 mmol/L (ref 22–32)
Calcium: 8.9 mg/dL (ref 8.9–10.3)
Chloride: 104 mmol/L (ref 98–111)
Creatinine, Ser: 1.03 mg/dL — ABNORMAL HIGH (ref 0.44–1.00)
GFR, Estimated: 58 mL/min — ABNORMAL LOW (ref >60)
Glucose, Bld: 101 mg/dL — ABNORMAL HIGH (ref 70–99)
Potassium: 4.2 mmol/L (ref 3.5–5.1)
Sodium: 134 mmol/L — ABNORMAL LOW (ref 135–145)

## 2021-07-05 MED ORDER — IOHEXOL 350 MG/ML SOLN
95.0000 mL | Freq: Once | INTRAVENOUS | Status: AC | PRN
  Administered 2021-07-05: 95 mL via INTRAVENOUS

## 2021-07-05 MED ORDER — NITROGLYCERIN 0.4 MG SL SUBL
SUBLINGUAL_TABLET | SUBLINGUAL | Status: AC
  Administered 2021-07-05: 0.8 mg via SUBLINGUAL
  Filled 2021-07-05: qty 2

## 2021-07-05 MED ORDER — DOFETILIDE 250 MCG PO CAPS
250.0000 ug | ORAL_CAPSULE | Freq: Two times a day (BID) | ORAL | Status: DC
  Administered 2021-07-05 – 2021-07-06 (×2): 250 ug via ORAL
  Filled 2021-07-05 (×2): qty 1

## 2021-07-05 MED ORDER — NITROGLYCERIN 0.4 MG SL SUBL: 0.8000 mg | SUBLINGUAL_TABLET | Freq: Once | SUBLINGUAL | Status: AC

## 2021-07-05 MED ORDER — ISOSORBIDE MONONITRATE ER 30 MG PO TB24
30.0000 mg | ORAL_TABLET | Freq: Every day | ORAL | Status: DC
  Administered 2021-07-05: 30 mg via ORAL
  Filled 2021-07-05: qty 1

## 2021-07-05 NOTE — Progress Notes (Signed)
Electrophysiology Rounding Note  Patient Name: Kathleen Sellers Date of Encounter: 07/05/2021  Primary Cardiologist: None  Electrophysiologist: Dr. Quentin Ore   Subjective   Pt  remains in NSR  on Tikosyn 500 mcg BID   QTc from EKG last pm shows  borderline QTc  at ~480-490 when measured manually  Had shoulder and lower abdominal last night. Relieved with NTG. 3/10 pain. She is anxious this am and feels like she's going to go back into AF.   Inpatient Medications    Scheduled Meds:  dofetilide  500 mcg Oral BID   LORazepam  0.5 mg Oral Daily   pantoprazole  40 mg Oral Daily   rivaroxaban  20 mg Oral Q supper   sertraline  50 mg Oral Daily   sodium chloride flush  3 mL Intravenous Q12H   Continuous Infusions:  sodium chloride     PRN Meds: sodium chloride, acetaminophen, nitroGLYCERIN, sodium chloride flush   Vital Signs    Vitals:   07/04/21 1920 07/04/21 1928 07/04/21 2309 07/05/21 0448  BP: 119/81 128/83 134/72 140/82  Pulse: 78 77 60 (!) 52  Resp: 18 15 16 14   Temp:  98.6 F (37 C) 97.9 F (36.6 C) 97.9 F (36.6 C)  TempSrc:  Oral Oral Oral  SpO2: 94%  95% 97%  Weight:      Height:        Intake/Output Summary (Last 24 hours) at 07/05/2021 0710 Last data filed at 07/05/2021 0449 Gross per 24 hour  Intake 720 ml  Output 5 ml  Net 715 ml   Filed Weights   07/03/21 0200 07/03/21 0343  Weight: 93.5 kg 91.6 kg    Physical Exam    GEN- The patient is well appearing, alert and oriented x 3 today.   Head- normocephalic, atraumatic Eyes-  Sclera clear, conjunctiva pink Ears- hearing intact Oropharynx- clear Neck- supple Lungs- Clear to ausculation bilaterally, normal work of breathing Heart- Regular rate and rhythm, no murmurs, rubs or gallops GI- soft, NT, ND, + BS Extremities- no clubbing, cyanosis, or edema Skin- no rash or lesion Psych- euthymic mood, full affect Neuro- strength and sensation are intact  Labs    CBC Recent Labs     07/02/21 1950 07/04/21 0235  WBC 6.6 4.2  HGB 15.1* 12.5  HCT 44.9 39.2  MCV 86.2 88.3  PLT 263 [id number]   Basic Metabolic Panel Recent Labs    07/04/21 0235 07/05/21 0206  NA 138 134*  K 3.8 4.2  CL 107 104  CO2 24 25  GLUCOSE 121* 101*  BUN 24* 21  CREATININE 1.27* 1.03*  CALCIUM 8.8* 8.9  MG 2.0 2.2    Potassium  Date/Time Value Ref Range Status  07/05/2021 02:06 AM 4.2 3.5 - 5.1 mmol/L Final   Magnesium  Date/Time Value Ref Range Status  07/05/2021 02:06 AM 2.2 1.7 - 2.4 mg/dL Final    Comment:    Performed at [facility], [address] [place], [place] [address]    Telemetry    NSR 60-70s mostly, ST 110s occasionally (personally reviewed)  Radiology    ECHOCARDIOGRAM COMPLETE  Result Date: 07/03/2021    ECHOCARDIOGRAM REPORT   Patient Name:   Kathleen Sellers Date of Exam: 07/03/2021 Medical Rec #:  [id number]        Height:       64.0 in Accession #:    [id number]       Weight:  202.0 lb Date of Birth:  1950/11/26        BSA:          1.965 m Patient Age:    70 years         BP:           111/66 mmHg Patient Gender: F                HR:           55 bpm. Exam Location:  Inpatient Procedure: 2D Echo, Cardiac Doppler and Color Doppler Indications:    Afib  History:        Patient has no prior history of Echocardiogram examinations.                 CAD; Risk Factors:Hypertension.  Sonographer:    Cleatis Polkaaylor Peper Referring Phys: [id number] Karl ItoLONNIE SULLIVAN IMPRESSIONS  1. Left ventricular ejection fraction, by estimation, is 60 to 65%. The left ventricle has normal function. The left ventricle has no regional wall motion abnormalities. Severe focal basal septal left ventricular hypertrophy. No significant LV outflow tract gradient noted, no mitral valve systolic anterior motion noted. Left ventricular diastolic parameters are consistent with Grade I diastolic dysfunction (impaired relaxation).  2. Right ventricular systolic function is normal. The right ventricular size is  normal. There is normal pulmonary artery systolic pressure. The estimated right ventricular systolic pressure is 25.5 mmHg.  3. The mitral valve is normal in structure. Trivial mitral valve regurgitation. No evidence of mitral stenosis.  4. The aortic valve is tricuspid. There is mild calcification of the aortic valve. Aortic valve regurgitation is trivial. Aortic valve sclerosis/calcification is present, without any evidence of aortic stenosis.  5. The inferior vena cava is normal in size with greater than 50% respiratory variability, suggesting right atrial pressure of 3 mmHg. FINDINGS  Left Ventricle: Left ventricular ejection fraction, by estimation, is 60 to 65%. The left ventricle has normal function. The left ventricle has no regional wall motion abnormalities. The left ventricular internal cavity size was normal in size. Severe focal basal septal left ventricular hypertrophy. Left ventricular diastolic parameters are consistent with Grade I diastolic dysfunction (impaired relaxation). Right Ventricle: The right ventricular size is normal. No increase in right ventricular wall thickness. Right ventricular systolic function is normal. There is normal pulmonary artery systolic pressure. The tricuspid regurgitant velocity is 2.37 m/s, and  with an assumed right atrial pressure of 3 mmHg, the estimated right ventricular systolic pressure is 25.5 mmHg. Left Atrium: Left atrial size was normal in size. Right Atrium: Right atrial size was normal in size. Pericardium: There is no evidence of pericardial effusion. Mitral Valve: The mitral valve is normal in structure. There is mild calcification of the mitral valve leaflet(s). Trivial mitral valve regurgitation. No evidence of mitral valve stenosis. Tricuspid Valve: The tricuspid valve is normal in structure. Tricuspid valve regurgitation is trivial. Aortic Valve: The aortic valve is tricuspid. There is mild calcification of the aortic valve. Aortic valve  regurgitation is trivial. Aortic regurgitation PHT measures 691 msec. Aortic valve sclerosis/calcification is present, without any evidence of aortic stenosis. Aortic valve peak gradient measures 10.6 mmHg. Pulmonic Valve: The pulmonic valve was normal in structure. Pulmonic valve regurgitation is trivial. Aorta: The aortic root is normal in size and structure. Venous: The inferior vena cava is normal in size with greater than 50% respiratory variability, suggesting right atrial pressure of 3 mmHg. IAS/Shunts: No atrial level shunt detected by color flow Doppler.  LEFT  VENTRICLE PLAX 2D LVIDd:         4.90 cm     Diastology LVIDs:         3.00 cm     LV e' medial:    5.55 cm/s LV PW:         1.10 cm     LV E/e' medial:  13.0 LV IVS:        1.20 cm     LV e' lateral:   6.31 cm/s LVOT diam:     2.00 cm     LV E/e' lateral: 11.4 LV SV:         122 LV SV Index:   62 LVOT Area:     3.14 cm  LV Volumes (MOD) LV vol d, MOD A2C: 92.3 ml LV vol d, MOD A4C: 97.7 ml LV vol s, MOD A2C: 38.2 ml LV vol s, MOD A4C: 40.8 ml LV SV MOD A2C:     54.1 ml LV SV MOD A4C:     97.7 ml LV SV MOD BP:      57.4 ml RIGHT VENTRICLE             IVC RV Basal diam:  3.40 cm     IVC diam: 1.30 cm RV Mid diam:    2.60 cm RV S prime:     11.30 cm/s TAPSE (M-mode): 2.6 cm LEFT ATRIUM             Index        RIGHT ATRIUM           Index LA diam:        4.00 cm 2.04 cm/m   RA Area:     11.30 cm LA Vol (A2C):   45.7 ml 23.26 ml/m  RA Volume:   25.80 ml  13.13 ml/m LA Vol (A4C):   46.0 ml 23.41 ml/m LA Biplane Vol: 45.7 ml 23.26 ml/m  AORTIC VALVE AV Area (Vmax): 2.91 cm AV Vmax:        163.00 cm/s AV Peak Grad:   10.6 mmHg LVOT Vmax:      151.00 cm/s LVOT Vmean:     111.000 cm/s LVOT VTI:       0.388 m AI PHT:         691 msec  AORTA Ao Root diam: 3.10 cm Ao Asc diam:  3.00 cm MITRAL VALVE               TRICUSPID VALVE MV Area (PHT): 3.24 cm    TR Peak grad:   22.5 mmHg MV Decel Time: 234 msec    TR Vmax:        237.00 cm/s MV E velocity: 72.00  cm/s MV A velocity: 75.80 cm/s  SHUNTS MV E/A ratio:  0.95        Systemic VTI:  0.39 m                            Systemic Diam: 2.00 cm Dalton McleanMD Electronically signed by Wilfred Lacy Signature Date/Time: 07/03/2021/3:32:36 PM    Final      Patient Profile     MILIYAH LUPER is a 71 y.o. female with a past medical history significant for persistent atrial fibrillation.  They were admitted for tikosyn load.   Assessment & Plan    Persistent atrial fibrillation Pt  remains in NSR  on Tikosyn 500 mcg BID  Continue Xarelto Electrolytes  stable this am CHA2DS2VASC is at least 4  2. 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  Will trend this am.  For completeness, will order CTA coronary.    Plan for home tomorrow if QTc remains stable and other work up unremarkable.   For questions or updates, please contact [place] Please consult [email or web address] for contact info under Cardiology/STEMI.  Signed, Shirley Friar, PA-C  07/05/2021, 7:10 AM

## 2021-07-05 NOTE — Progress Notes (Signed)
Pharmacy: Dofetilide (Tikosyn) - Follow Up ?Assessment and Electrolyte Replacement ? ?Pharmacy consulted to assist in monitoring and replacing electrolytes in this 71 y.o. female admitted on 07/02/2021 undergoing dofetilide initiation.  ? ?Labs: ?   ?Component Value Date/Time  ? K 4.2 07/05/2021 0206  ? MG 2.2 07/05/2021 0206  ?  ? ?Plan: ?Potassium: ?K >/= 4: No additional supplementation needed ? ?Magnesium: ?Mg > 2: No additional supplementation needed ? ? ?Thank you for allowing pharmacy to participate in this patient's care  ? ?Hildred Laser, PharmD ?Clinical Pharmacist ?**Pharmacist phone directory can now be found on [email or web address] ([email or web address]).  Listed under [place]. ? ? ?

## 2021-07-05 NOTE — TOC Benefit Eligibility Note (Signed)
Patient Advocate Encounter ?  ?Insurance verification completed.   ?  ?The patient is currently admitted and upon discharge could be taking TIKOSYN. ?  ?The current 30 day co-pay is, $19.68.  ? ?The patient is insured through [employer]. ? ? ?  ? ?

## 2021-07-05 NOTE — Progress Notes (Signed)
Morning EKG reviewed   ? ?Shows remains in NSR.  ? ?QT gradually prolonging and currently appears 520 uncorrected, likely 490+ corrected.  ? ?Her CrCl is also borderline.  ? ?Decrease Tikosyn to 250 mcg BID.  ? ?Plan for home tomorrow if QTc remains stable.  ? ?Graciella Freer, PA-C  ?Pager: [phone]  ?07/05/2021 1:18 PM  ? ?

## 2021-07-05 NOTE — Care Management (Signed)
07-05-21 Case Manager spoke with patient regarding Tikosyn cost and she is agreeable to cost. Patient would like her initial Rx to be filled via [facility] and Rx refills sent to [facility]. No further needs from Case Manager at this time.  ?

## 2021-07-06 ENCOUNTER — Inpatient Hospital Stay (HOSPITAL_COMMUNITY)
Admit: 2021-07-06 | Discharge: 2021-07-06 | Disposition: A | Discharge status: Home / Self Care | Payer: PRIVATE HEALTH INSURANCE | Attending: Cardiology | Admitting: Cardiology

## 2021-07-06 ENCOUNTER — Other Ambulatory Visit: Payer: Self-pay

## 2021-07-06 ENCOUNTER — Other Ambulatory Visit (HOSPITAL_COMMUNITY): Payer: Self-pay

## 2021-07-06 DIAGNOSIS — R931 Abnormal findings on diagnostic imaging of heart and coronary circulation: Secondary | ICD-10-CM

## 2021-07-06 LAB — BASIC METABOLIC PANEL
Anion gap: 9 (ref 5–15)
BUN: 21 mg/dL (ref 8–23)
CO2: 24 mmol/L (ref 22–32)
Calcium: 9.3 mg/dL (ref 8.9–10.3)
Chloride: 103 mmol/L (ref 98–111)
Creatinine, Ser: 1.1 mg/dL — ABNORMAL HIGH (ref 0.44–1.00)
GFR, Estimated: 54 mL/min — ABNORMAL LOW (ref >60)
Glucose, Bld: 94 mg/dL (ref 70–99)
Potassium: 4 mmol/L (ref 3.5–5.1)
Sodium: 136 mmol/L (ref 135–145)

## 2021-07-06 LAB — LIPID PANEL
Cholesterol: 206 mg/dL — ABNORMAL HIGH (ref 0–200)
HDL: 29 mg/dL — ABNORMAL LOW (ref >40)
LDL Cholesterol: 127 mg/dL — ABNORMAL HIGH (ref 0–99)
Total CHOL/HDL Ratio: 7.1 RATIO
Triglycerides: 249 mg/dL — ABNORMAL HIGH (ref <150)
VLDL: 50 mg/dL — ABNORMAL HIGH (ref 0–40)

## 2021-07-06 LAB — MAGNESIUM: Magnesium: 2.1 mg/dL (ref 1.7–2.4)

## 2021-07-06 LAB — T4, FREE: Free T4: 0.65 ng/dL (ref 0.61–1.12)

## 2021-07-06 MED ORDER — ISOSORBIDE MONONITRATE ER 30 MG PO TB24
15.0000 mg | ORAL_TABLET | Freq: Every day | ORAL | 6 refills | Status: DC
  Filled 2021-07-06: qty 15, 30d supply, fill #0

## 2021-07-06 MED ORDER — SERTRALINE HCL 50 MG PO TABS
50.0000 mg | ORAL_TABLET | Freq: Every day | ORAL | 6 refills | Status: AC
  Filled 2021-07-06: qty 30, 30d supply, fill #0

## 2021-07-06 MED ORDER — ROSUVASTATIN CALCIUM 20 MG PO TABS
20.0000 mg | ORAL_TABLET | Freq: Every day | ORAL | Status: DC
  Administered 2021-07-06: 08:00:00 20 mg via ORAL
  Filled 2021-07-06: qty 1

## 2021-07-06 MED ORDER — DOFETILIDE 250 MCG PO CAPS
250.0000 ug | ORAL_CAPSULE | Freq: Two times a day (BID) | ORAL | 6 refills | Status: DC
  Filled 2021-07-06: qty 60, 30d supply, fill #0

## 2021-07-06 MED ORDER — ROSUVASTATIN CALCIUM 20 MG PO TABS
20.0000 mg | ORAL_TABLET | Freq: Every day | ORAL | 6 refills | Status: AC
  Filled 2021-07-06: qty 30, 30d supply, fill #0

## 2021-07-06 MED ORDER — ACETAMINOPHEN 325 MG PO TABS: 650.0000 mg | ORAL_TABLET | ORAL | Status: DC | PRN

## 2021-07-06 MED ORDER — NITROGLYCERIN 0.4 MG SL SUBL
0.4000 mg | SUBLINGUAL_TABLET | SUBLINGUAL | 6 refills | Status: AC | PRN
  Filled 2021-07-06: qty 100, 30d supply, fill #0

## 2021-07-06 MED ORDER — ISOSORBIDE MONONITRATE ER 30 MG PO TB24
15.0000 mg | ORAL_TABLET | Freq: Every day | ORAL | Status: DC
  Administered 2021-07-06: 11:00:00 15 mg via ORAL
  Filled 2021-07-06: qty 1

## 2021-07-06 NOTE — Discharge Summary (Signed)
? ? ? ?ELECTROPHYSIOLOGY PROCEDURE DISCHARGE SUMMARY  ? ? ?Patient ID: Kathleen Sellers,  ?MRN: [id number], DOB/AGE: [birth year] 71 y.o. ? ?Admit date: 07/02/2021 ?Discharge date: 07/06/2021 ? ?Primary Care Physician: No primary care provider on file.  ?Primary Cardiologist: [facility] ?Electrophysiologist: New to Dr. Quentin Ore ? ?Primary Discharge Diagnosis:  ?1.  Persistent atrial fibrillation status post Tikosyn loading this admission ? ?Secondary Discharge Diagnosis:  ?2. Post conversion pauses ?3. Abdominal / Scapular pain ?4. Moderate, non-obstructive CAD ?5. Mixed HLD ?6. Obesity ? ?Allergies  ?Allergen Reactions  ? Sulfamethoxazole-Trimethoprim   ?  Rash ?  ? Sulfonamide Derivatives   ?  rash  ? Cefaclor   ?  Itching w/o rash  ? ? ? ?Procedures This Admission:  ?1.  Tikosyn loading ?2. Echo 07/03/2021 LVEF 60-65% ?3. CT Coronary 07/05/2021 ?- Coronary calcium score of 938. This was 97th percentile for age ?and sex matched control. ?- Normal coronary origin with codominance. ?- Calcified plaque in the left main causes minimal (0-24%) stenosis ?- Calcified plaque in the proximal LAD causes mild (25-49%) ?stenosis. Calcified plaque in D1 causes minimal (0-24%) stenosis. ? - Calcified plaque in the proximal LCX causes mild (25-49%) ?stenosis. Calcified plaque in the mid LCX causes moderate (50-69%) ?stenosis. Calcified plaque in ostial OM1 causes mild (25-49%) ?Stenosis ? - Calcified plaque in the proximal and mid RCA causes mild (25-49%) ?stenosis. ? - Mild aortic valve calcifications ? - Will send study for CTFFR ?4. CTFFR ran 07/06/2021 suggests nonobstructive CAD ?  ?CAD-RADS 3. Moderate stenosis. Consider symptom-guided anti-ischemic ?pharmacotherapy as well as risk factor modification per guideline ?directed care. Additional analysis with CT FFR will be submitted. ? ?Brief HPI: ?Kathleen Sellers is a 71 y.o. female with a past medical history as noted above.  They were referred to EP in the outpatient setting for  treatment options of atrial fibrillation.  Risks, benefits, and alternatives to Tikosyn were reviewed with the patient who wished to proceed.   ? ?Hospital Course:  ?The patient was admitted with AF RVR and numerous post conversion pauses on diltiazem. This was stopped and with pauses only occurring with AF RVR and conversion, shared decision making was used to decide on Tikosyn initiation with possible consideration of ablation in the future.  ? ?Pt remained in NSR with stable QT on telemetry.  ? ?Pt had non-specific symptoms of left shoulder pain and abdominal cramping.  HS trop negative. With h/o non obstructive CAD, pt underwent CTA Coronary which showed Moderate Non-Obstructive disease, reviewed with Dr. Gardiner Rhyme who recommended medical therapy and risk factor modification.  ? ? Tikosyn was initiated.  Renal function and electrolytes were followed during the hospitalization. Her Qtc prolonged slightly as well as borderline CrCl, resulting on down titration to 250 mcg BID. Their QTc remained stable.  They were monitored until discharge on telemetry which demonstrated NSR.  On the day of discharge, they were examined by Dr. Quentin Ore  who considered them stable for discharge to home.  Follow-up has been arranged with the Atrial Fibrillation clinic in approximately 1 week and with  EP APP   in 4 weeks.  ? ?Physical Exam: ?Vitals:  ? 07/05/21 0759 07/05/21 1324 07/05/21 2024 07/06/21 0518  ?BP: (!) 145/68 137/80 111/65 98/66  ?Pulse: (!) 56 (!) 55 66 73  ?Resp:   19 17  ?Temp: 97.7 ?F (36.5 ?C)  98.4 ?F (36.9 ?C) 97.8 ?F (36.6 ?C)  ?TempSrc: Oral  Oral Oral  ?SpO2: 96%  98%   ?  Weight:      ?Height:      ? ? ?GEN- The patient is well appearing, alert and oriented x 3 today.   ?HEENT: normocephalic, atraumatic; sclera clear, conjunctiva pink; hearing intact; oropharynx clear; neck supple, no JVP ?Lymph- no cervical lymphadenopathy ?Lungs- Clear to ausculation bilaterally, normal work of breathing.  No wheezes, rales,  rhonchi ?Heart- Regular rate and rhythm, no murmurs, rubs or gallops, PMI not laterally displaced ?GI- soft, non-tender, non-distended, bowel sounds present, no hepatosplenomegaly ?Extremities- no clubbing, cyanosis, or edema; DP/PT/radial pulses 2+ bilaterally ?MS- no significant deformity or atrophy ?Skin- warm and dry, no rash or lesion ?Psych- euthymic mood, full affect ?Neuro- strength and sensation are intact ? ? ?Labs: ?  ?Lab Results  ?Component Value Date  ? WBC 4.2 07/04/2021  ? HGB 12.5 07/04/2021  ? HCT 39.2 07/04/2021  ? MCV 88.3 07/04/2021  ? PLT 192 07/04/2021  ?  ?Recent Labs  ?Lab 07/06/21 ?0233  ?NA 136  ?K 4.0  ?CL 103  ?CO2 24  ?BUN 21  ?CREATININE 1.10*  ?CALCIUM 9.3  ?GLUCOSE 94  ? ? ? ?Discharge Medications:  ?Allergies as of 07/06/2021   ? ?   Reactions  ? Sulfamethoxazole-trimethoprim   ? Rash  ? Sulfonamide Derivatives   ? rash  ? Cefaclor   ? Itching w/o rash  ? ?  ? ?  ?Medication List  ?  ? ?STOP taking these medications   ? ?diltiazem 30 MG tablet ?Commonly known as: Cardizem ?  ?escitalopram 20 MG tablet ?Commonly known as: LEXAPRO ?  ? ?  ? ?TAKE these medications   ? ?acetaminophen 325 MG tablet ?Commonly known as: TYLENOL ?Take 2 tablets (650 mg total) by mouth every 4 (four) hours as needed for headache or mild pain. ?  ?dofetilide 250 MCG capsule ?Commonly known as: TIKOSYN ?Take 1 capsule (250 mcg total) by mouth 2 (two) times daily. ?  ?isosorbide mononitrate 30 MG 24 hr tablet ?Commonly known as: IMDUR ?Take 1/2 tablet (15 mg total) by mouth daily. ?Start taking on: July 07, 2021 ?  ?LORazepam 0.5 MG tablet ?Commonly known as: ATIVAN ?Take 0.5 mg by mouth daily. ?  ?nitroGLYCERIN 0.4 MG SL tablet ?Commonly known as: NITROSTAT ?Place 1 tablet (0.4 mg total) under the tongue every 5 (five) minutes as needed for chest pain (If more than 3 tabs needed, call 911.). ?  ?omeprazole 40 MG capsule ?Commonly known as: PRILOSEC ?Take 40 mg by mouth daily. ?  ?rivaroxaban 20 MG Tabs  tablet ?Commonly known as: XARELTO ?Take 1 tablet (20 mg total) by mouth daily with supper. ?  ?rosuvastatin 20 MG tablet ?Commonly known as: CRESTOR ?Take 1 tablet (20 mg total) by mouth daily. ?Start taking on: July 07, 2021 ?  ?sertraline 50 MG tablet ?Commonly known as: ZOLOFT ?Take 1 tablet (50 mg total) by mouth daily. ?Start taking on: July 07, 2021 ?  ? ?  ? ? ?Disposition:  ? ? Follow-up Information   ? ? [facility] Follow up.   ?Specialty: Cardiology ?Why: on 3/16 at 1130 am for post hospital follow up ?Contact information: ?[address] ?[id number] mc ?[place] ?[phone] ? ?  ?  ? ?  ?  ? ?  ? ? ?Duration of Discharge Encounter: Greater than 30 minutes including physician time. ? ?Signed, ?Shirley Friar, PA-C  ?07/06/2021 ?11:57 AM ? ? ? ? ?

## 2021-07-06 NOTE — Progress Notes (Signed)
EKG from yesterday evening 07/05/2021 reviewed   ? ?Shows remains in NSR with borderline QTc at ~490 ms. ? ?Continue  Tikosyn 250 mcg BID.  ? ?Plan for home today if QTc remains stable.   ? ?Graciella Freer, PA-C  ?Pager: [phone]  ?07/06/2021 7:21 AM  ? ?

## 2021-07-07 LAB — T3, FREE: T3, Free: 2.5 pg/mL (ref 2.0–4.4)

## 2021-07-13 ENCOUNTER — Telehealth: Payer: Self-pay

## 2021-07-13 ENCOUNTER — Encounter (HOSPITAL_COMMUNITY): Payer: PRIVATE HEALTH INSURANCE | Admitting: Physician Assistant

## 2021-07-13 NOTE — Telephone Encounter (Signed)
Received paperwork In Kathleen Sellers's mailbox to fill out for this pt to get FMLA.  ?Paperwork has been completed and signed and given to [place] in medical records to complete process.  ?

## 2021-07-14 ENCOUNTER — Emergency Department (HOSPITAL_COMMUNITY): Payer: PRIVATE HEALTH INSURANCE

## 2021-07-14 ENCOUNTER — Inpatient Hospital Stay (HOSPITAL_COMMUNITY)
Admission: EM | Admit: 2021-07-14 | Discharge: 2021-07-16 | DRG: 310 | Disposition: A | Discharge status: Home / Self Care | Payer: PRIVATE HEALTH INSURANCE | Attending: Internal Medicine | Admitting: Internal Medicine

## 2021-07-14 ENCOUNTER — Other Ambulatory Visit: Payer: Self-pay

## 2021-07-14 ENCOUNTER — Encounter (HOSPITAL_COMMUNITY): Payer: Self-pay

## 2021-07-14 DIAGNOSIS — G4730 Sleep apnea, unspecified: Secondary | ICD-10-CM | POA: Diagnosis present

## 2021-07-14 DIAGNOSIS — Z91199 Patient's noncompliance with other medical treatment and regimen due to unspecified reason: Secondary | ICD-10-CM

## 2021-07-14 DIAGNOSIS — I959 Hypotension, unspecified: Secondary | ICD-10-CM | POA: Diagnosis present

## 2021-07-14 DIAGNOSIS — Z823 Family history of stroke: Secondary | ICD-10-CM | POA: Diagnosis not present

## 2021-07-14 DIAGNOSIS — E039 Hypothyroidism, unspecified: Secondary | ICD-10-CM | POA: Diagnosis present

## 2021-07-14 DIAGNOSIS — Z79899 Other long term (current) drug therapy: Secondary | ICD-10-CM | POA: Diagnosis not present

## 2021-07-14 DIAGNOSIS — Z8249 Family history of ischemic heart disease and other diseases of the circulatory system: Secondary | ICD-10-CM

## 2021-07-14 DIAGNOSIS — R0602 Shortness of breath: Secondary | ICD-10-CM

## 2021-07-14 DIAGNOSIS — I4819 Other persistent atrial fibrillation: Secondary | ICD-10-CM | POA: Diagnosis not present

## 2021-07-14 DIAGNOSIS — I495 Sick sinus syndrome: Secondary | ICD-10-CM | POA: Diagnosis present

## 2021-07-14 DIAGNOSIS — Z7901 Long term (current) use of anticoagulants: Secondary | ICD-10-CM

## 2021-07-14 DIAGNOSIS — Z801 Family history of malignant neoplasm of trachea, bronchus and lung: Secondary | ICD-10-CM

## 2021-07-14 DIAGNOSIS — Z803 Family history of malignant neoplasm of breast: Secondary | ICD-10-CM | POA: Diagnosis not present

## 2021-07-14 DIAGNOSIS — Z888 Allergy status to other drugs, medicaments and biological substances status: Secondary | ICD-10-CM

## 2021-07-14 DIAGNOSIS — G4734 Idiopathic sleep related nonobstructive alveolar hypoventilation: Secondary | ICD-10-CM | POA: Diagnosis present

## 2021-07-14 DIAGNOSIS — I4891 Unspecified atrial fibrillation: Secondary | ICD-10-CM | POA: Diagnosis present

## 2021-07-14 DIAGNOSIS — I455 Other specified heart block: Secondary | ICD-10-CM

## 2021-07-14 DIAGNOSIS — I480 Paroxysmal atrial fibrillation: Secondary | ICD-10-CM | POA: Diagnosis not present

## 2021-07-14 DIAGNOSIS — E782 Mixed hyperlipidemia: Secondary | ICD-10-CM | POA: Diagnosis present

## 2021-07-14 DIAGNOSIS — E785 Hyperlipidemia, unspecified: Secondary | ICD-10-CM | POA: Diagnosis present

## 2021-07-14 DIAGNOSIS — I10 Essential (primary) hypertension: Secondary | ICD-10-CM | POA: Diagnosis present

## 2021-07-14 DIAGNOSIS — Z833 Family history of diabetes mellitus: Secondary | ICD-10-CM

## 2021-07-14 DIAGNOSIS — Z882 Allergy status to sulfonamides status: Secondary | ICD-10-CM

## 2021-07-14 LAB — BASIC METABOLIC PANEL
Anion gap: 9 (ref 5–15)
BUN: 16 mg/dL (ref 8–23)
CO2: 22 mmol/L (ref 22–32)
Calcium: 9.2 mg/dL (ref 8.9–10.3)
Chloride: 107 mmol/L (ref 98–111)
Creatinine, Ser: 1.02 mg/dL — ABNORMAL HIGH (ref 0.44–1.00)
GFR, Estimated: 59 mL/min — ABNORMAL LOW (ref >60)
Glucose, Bld: 192 mg/dL — ABNORMAL HIGH (ref 70–99)
Potassium: 3.9 mmol/L (ref 3.5–5.1)
Sodium: 138 mmol/L (ref 135–145)

## 2021-07-14 LAB — CBC
HCT: 41.3 % (ref 36.0–46.0)
Hemoglobin: 13.9 g/dL (ref 12.0–15.0)
MCH: 28.9 pg (ref 26.0–34.0)
MCHC: 33.7 g/dL (ref 30.0–36.0)
MCV: 85.9 fL (ref 80.0–100.0)
Platelets: 204 10*3/uL (ref 150–400)
RBC: 4.81 MIL/uL (ref 3.87–5.11)
RDW: 13.3 % (ref 11.5–15.5)
WBC: 5.7 10*3/uL (ref 4.0–10.5)
nRBC: 0 % (ref 0.0–0.2)

## 2021-07-14 LAB — MAGNESIUM: Magnesium: 1.9 mg/dL (ref 1.7–2.4)

## 2021-07-14 MED ORDER — RIVAROXABAN 20 MG PO TABS
20.0000 mg | ORAL_TABLET | Freq: Every day | ORAL | Status: DC
  Administered 2021-07-14 – 2021-07-15 (×2): 20 mg via ORAL
  Filled 2021-07-14 (×2): qty 1

## 2021-07-14 MED ORDER — ACETAMINOPHEN 325 MG PO TABS: 650.0000 mg | ORAL_TABLET | ORAL | Status: DC | PRN

## 2021-07-14 MED ORDER — LORAZEPAM 0.5 MG PO TABS
0.5000 mg | ORAL_TABLET | Freq: Every morning | ORAL | Status: DC
Start: 2021-07-15 — End: 2021-07-16
  Administered 2021-07-15 – 2021-07-16 (×2): 0.5 mg via ORAL
  Filled 2021-07-14 (×2): qty 1

## 2021-07-14 MED ORDER — SERTRALINE HCL 50 MG PO TABS
50.0000 mg | ORAL_TABLET | Freq: Every day | ORAL | Status: DC
Start: 2021-07-08 — End: 2021-07-16
  Administered 2021-07-15 – 2021-07-16 (×2): 50 mg via ORAL
  Filled 2021-07-14 (×2): qty 1

## 2021-07-14 MED ORDER — SODIUM CHLORIDE 0.9 % IV BOLUS
1000.0000 mL | Freq: Once | INTRAVENOUS | Status: AC
Start: 2021-07-14 — End: 2021-07-14
  Administered 2021-07-14: 1000 mL via INTRAVENOUS

## 2021-07-14 MED ORDER — ROSUVASTATIN CALCIUM 20 MG PO TABS
20.0000 mg | ORAL_TABLET | Freq: Every evening | ORAL | Status: DC
  Administered 2021-07-14 – 2021-07-15 (×2): 20 mg via ORAL
  Filled 2021-07-14 (×2): qty 1

## 2021-07-14 MED ORDER — AMIODARONE HCL 200 MG PO TABS
400.0000 mg | ORAL_TABLET | Freq: Two times a day (BID) | ORAL | Status: DC
  Administered 2021-07-15 – 2021-07-16 (×3): 400 mg via ORAL
  Filled 2021-07-14 (×3): qty 2

## 2021-07-14 MED ORDER — PANTOPRAZOLE SODIUM 40 MG PO TBEC
80.0000 mg | DELAYED_RELEASE_TABLET | Freq: Every day | ORAL | Status: DC
  Administered 2021-07-15 – 2021-07-16 (×2): 80 mg via ORAL
  Filled 2021-07-14 (×2): qty 2

## 2021-07-14 MED ORDER — DILTIAZEM HCL-DEXTROSE 125-5 MG/125ML-% IV SOLN (PREMIX)
5.0000 mg/h | INTRAVENOUS | Status: DC
  Administered 2021-07-14: 5 mg/h via INTRAVENOUS
  Filled 2021-07-14: qty 125

## 2021-07-14 MED ORDER — NITROGLYCERIN 0.4 MG SL SUBL: 0.4000 mg | SUBLINGUAL_TABLET | SUBLINGUAL | Status: DC | PRN

## 2021-07-14 MED ORDER — DILTIAZEM LOAD VIA INFUSION
15.0000 mg | Freq: Once | INTRAVENOUS | Status: AC
  Administered 2021-07-14: 15 mg via INTRAVENOUS
  Filled 2021-07-14: qty 15

## 2021-07-14 NOTE — H&P (Signed)
Physician History and Physical  ? ? ? ?Patient ID: ?Kathleen Sellers ?MRN: [id number] ?DOB/AGE: 1950-11-26 71 y.o. ?Admit date: 07/14/2021 ? ?Primary Care Physician: Mancel Bale, PA-C ?Primary Cardiologist: Quentin Ore ? ?Active Problems: ?  * No active hospital problems. * ? ? ?HPI:  71 y.o. asked to see by Dr Langston Masker for rapid afib. Patient Recently d/c from [facility] 07/06/21 with PAF and long termination pauses. She was started on Tikosyn but had QT prolongation and only was d/c on 250 ug bid. She has been on xarelto for anticoagulation. 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 Given cardizem but BP dropped and it was d/c Currently she is in NSR with PACls She has had long termination pauses. She had cardiac CTA done 07/05/21 with calcium score 938 CAD RADS 3 with tightest lesion in mid LCX and negative f/u FFR CT  ? ?Review of systems complete and found to be negative unless listed above  ? ?Past Medical History:  ?Diagnosis Date  ? CAD (coronary artery disease)   ? Cerebral atrophy ([place]) 02/07/2011  ? done to assess head trauma ( [facility])  ? Colon polyps 2005  ? Depression   ? Hyperglycemia   ? Hyperlipidemia   ? Hypertension   ? Hypothyroidism   ?  ?Family History  ?Problem Relation Age of Onset  ? Stroke Father   ?     cerebral hemorrhage  ? Diabetes Mother   ? Hypertension Mother   ? Lung cancer Mother   ?     with bone metastases  ? Coronary artery disease Paternal Uncle   ? Breast cancer Maternal Grandmother   ? Heart attack Paternal Grandfather   ?     in 2s  ?  ?Social History  ? ?Socioeconomic History  ? Marital status: Married  ?  Spouse name: Not on file  ? Number of children: 3  ? Years of education: Not on file  ? Highest education level: Not on file  ?Occupational History  ? Occupation: Receptionist - [employer]  ?  Employer: [employer]  ?Tobacco Use  ? Smoking status:  Never  ? Smokeless tobacco: Never  ?Substance and Sexual Activity  ? Alcohol use: No  ? Drug use: No  ? Sexual activity: Not on file  ?Other Topics Concern  ? Not on file  ?Social History Narrative  ? No reg exercise  ? ?Social Determinants of Health  ? ?Financial Resource Strain: Not on file  ?Food Insecurity: Not on file  ?Transportation Needs: Not on file  ?Physical Activity: Not on file  ?Stress: Not on file  ?Social Connections: Not on file  ?Intimate Partner Violence: Not on file  ?  ?Past Surgical History:  ?Procedure Laterality Date  ? APPENDECTOMY  1970  ? with USO  ? CARDIAC CATHETERIZATION  2003  ? CATARACT EXTRACTION    ? Dr Gershon Crane  ? CHOLECYSTECTOMY  2005  ? COLONOSCOPY W/ POLYPECTOMY  2006  ? Dr. Penelope Coop  ? Hysterectomy & USO  1995  ? NASAL SINUS SURGERY  1988  ? Dr. Ernesto Rutherford  ? OOPHORECTOMY  1970  ? benign tumor  ?  ? ?(Not in a hospital admission) ? ? ?Physical Exam: ?Blood pressure 102/73, pulse (!) 134, temperature 98.1 ?F (36.7 ?C), temperature source Oral, resp. rate 10, height 5\' 4"  (1.626 m), weight 90.7 kg, SpO2 92 %.   ? ?  Affect appropriate ?Healthy:  appears stated age ?HEENT: normal ?Neck supple with no adenopathy ?JVP normal no bruits no thyromegaly ?Lungs clear with no wheezing and good diaphragmatic motion ?Heart:  S1/S2 no murmur, no rub, gallop or click ?PMI normal ?Abdomen: benighn, BS positve, no tenderness, no AAA ?no bruit.  No HSM or HJR ?Distal pulses intact with no bruits ?No edema ?Neuro non-focal ?Skin warm and dry ?No muscular weakness ? ?No current facility-administered medications on file prior to encounter.  ? ?Current Outpatient Medications on File Prior to Encounter  ?Medication Sig Dispense Refill  ? acetaminophen (TYLENOL) 325 MG tablet Take 2 tablets (650 mg total) by mouth every 4 (four) hours as needed for headache or mild pain.    ? dofetilide (TIKOSYN) 250 MCG capsule Take 1 capsule (250 mcg total) by mouth 2 (two) times daily. 60 capsule 6  ? isosorbide  mononitrate (IMDUR) 30 MG 24 hr tablet Take 1/2 tablet (15 mg total) by mouth daily. 15 tablet 6  ? LORazepam (ATIVAN) 0.5 MG tablet Take 0.5 mg by mouth daily.    ? nitroGLYCERIN (NITROSTAT) 0.4 MG SL tablet Place 1 tablet (0.4 mg total) under the tongue every 5 (five) minutes as needed for chest pain (If more than 3 tabs needed, call 911.). 100 tablet 6  ? omeprazole (PRILOSEC) 40 MG capsule Take 40 mg by mouth daily.    ? rivaroxaban (XARELTO) 20 MG TABS tablet Take 1 tablet (20 mg total) by mouth daily with supper. 30 tablet 0  ? rosuvastatin (CRESTOR) 20 MG tablet Take 1 tablet (20 mg total) by mouth daily. 30 tablet 6  ? sertraline (ZOLOFT) 50 MG tablet Take 1 tablet (50 mg total) by mouth daily. 30 tablet 6  ? ? ?Labs: ?  ?Lab Results  ?Component Value Date  ? WBC 5.7 07/14/2021  ? HGB 13.9 07/14/2021  ? HCT 41.3 07/14/2021  ? MCV 85.9 07/14/2021  ? PLT 204 07/14/2021  ?  ?Recent Labs  ?Lab 07/14/21 ?1018  ?NA 138  ?K 3.9  ?CL 107  ?CO2 22  ?BUN 16  ?CREATININE 1.02*  ?CALCIUM 9.2  ?GLUCOSE 192*  ? ?Lab Results  ?Component Value Date  ? CKTOTAL 83 06/28/2010  ? CKMB 1.0 06/28/2010  ? TROPONINI 0.01        ?NO INDICATION OF ?MYOCARDIAL INJURY. 06/28/2010  ? ?  ?Lab Results  ?Component Value Date  ? CHOL 206 (H) 07/06/2021  ? CHOL 107 09/12/2012  ? CHOL 108 12/23/2008  ? ?Lab Results  ?Component Value Date  ? HDL 29 (L) 07/06/2021  ? HDL 37.80 (L) 09/12/2012  ? HDL 34.70 (L) 12/23/2008  ? ?Lab Results  ?Component Value Date  ? LDLCALC 127 (H) 07/06/2021  ? [place] 52 09/12/2012  ? [place] 50 12/23/2008  ? ?Lab Results  ?Component Value Date  ? TRIG 249 (H) 07/06/2021  ? TRIG 86.0 09/12/2012  ? TRIG 118.0 12/23/2008  ? ?Lab Results  ?Component Value Date  ? CHOLHDL 7.1 07/06/2021  ? CHOLHDL 3 09/12/2012  ? CHOLHDL 3 12/23/2008  ? ?No results found for: LDLDIRECT  ? ?  ?Radiology: ?DG Chest 1 View ? ?Result Date: 06/17/2021 ?CLINICAL DATA:  Chest pain.  History of atrial fibrillation. EXAM: CHEST  1 VIEW  COMPARISON:  06/13/2021 FINDINGS: Heart size and mediastinal contours are unremarkable. No pleural effusion or edema identified. No airspace densities. IMPRESSION: No acute cardiopulmonary abnormalities. Electronically Signed   By: Kerby Moors M.D.   On: 06/17/2021  06:44  ? ?CT CORONARY MORPH W/CTA COR W/SCORE W/CA W/CM &/OR WO/CM ? ?Addendum Date: 07/05/2021   ?ADDENDUM REPORT: 07/05/2021 15:58 CLINICAL DATA:  13F with shoulder pain EXAM: Cardiac/Coronary CTA TECHNIQUE: The patient was scanned on a [employer]. FINDINGS: A 100 kV prospective scan was triggered in the descending thoracic aorta at 111 HU's. Axial non-contrast 3 mm slices were carried out through the heart. The data set was analyzed on a dedicated work station and scored using the [place]. Gantry rotation speed was 250 msecs and collimation was .6 mm. 0.8 mg of sl NTG was given. The 3D data set was reconstructed in 5% intervals of the 35-75 % of the R-R cycle. Diastolic phases were analyzed on a dedicated work station using MPR, MIP and VRT modes. The patient received 80 cc of contrast. Coronary Arteries:  Normal coronary origin.  Codominance. RCA is a large codominant artery that gives rise to PDA and PLA. Calcified plaque in the proximal RCA causes 25-49% stenosis. Calcified plaque in the mid RCA causes 25-49% stenosis. Left main is a large artery that gives rise to LAD and LCX arteries. Calcified plaque in the left main causes 0-24% stenosis LAD is a large vessel. Calcified plaque in the proximal LAD causes 25-49% stenosis. Calcified plaque in D1 causes 0-24%. LCX is a codominant artery that gives rise to one large OM1 branch. Calcified plaque in the proximal LCX causes 25-49% stenosis. Calcified plaque in the mid LCX causes 50-69% stenos Calcified plaque in ostial OM1 causes 25-49% stenosis Other findings: Left Ventricle: Normal size Left Atrium: Normal size Pulmonary Veins: Normal configuration Right Ventricle: Mild enlargement  Right Atrium: Normal size Cardiac valves: Mild AV calcifications (AV calcium score 86). Mild mitral annular calcifications. Thoracic aorta: Normal size Pulmonary Arteries: Normal size Systemic Veins: Normal drainage Per

## 2021-07-14 NOTE — ED Notes (Signed)
Patient c/o not feeling good  heart rate a fib with pauses , cardizem stopped per Dr. Renaye Rakers ?

## 2021-07-14 NOTE — ED Notes (Signed)
Informed RN/NT of patient's monitor alerts and fluctuations. ?

## 2021-07-14 NOTE — ED Provider Notes (Signed)
?[facility] ?Provider Note ? ? ?CSN: [id number] ?Arrival date & time: 07/14/21  1007 ? ?  ? ?History ? ?Chief Complaint  ?Patient presents with  ? Chest Pain  ? Shortness of Breath  ? ? ?Kathleen Sellers is a 71 y.o. female with a history of hyperlipidemia, CAD atrial fibrillation diagnosed in February presenting today with A-fib RVR.  She reports that this morning around 830 she started to feel pressure in the back of her shoulder blades and some anterior chest discomfort.  Simultaneously she felt lightheaded.  She says this is how she felt each time that she has gone into A-fib RVR.  She was originally placed on Xarelto and diltiazem however she was changed from diltiazem to Tikosyn.  Reports taking her Tikosyn this morning and has not missed any Xarelto doses.  Denies any chest pain, endorsing anxiety.  Of note, patient was admitted to the hospital on 3/5 and discharged on 3/9 for the same presentation. ? ? ?Home Medications ?Prior to Admission medications   ?Medication Sig Start Date End Date Taking? Authorizing Provider  ?acetaminophen (TYLENOL) 325 MG tablet Take 2 tablets (650 mg total) by mouth every 4 (four) hours as needed for headache or mild pain. 07/06/21   Shirley Friar, PA-C  ?dofetilide (TIKOSYN) 250 MCG capsule Take 1 capsule (250 mcg total) by mouth 2 (two) times daily. 07/06/21   Shirley Friar, PA-C  ?isosorbide mononitrate (IMDUR) 30 MG 24 hr tablet Take 1/2 tablet (15 mg total) by mouth daily. 07/07/21   Shirley Friar, PA-C  ?LORazepam (ATIVAN) 0.5 MG tablet Take 0.5 mg by mouth daily. 05/08/21   [provider]  ?nitroGLYCERIN (NITROSTAT) 0.4 MG SL tablet Place 1 tablet (0.4 mg total) under the tongue every 5 (five) minutes as needed for chest pain (If more than 3 tabs needed, call 911.). 07/06/21   Shirley Friar, PA-C  ?omeprazole (PRILOSEC) 40 MG capsule Take 40 mg by mouth daily. 05/29/21   [provider]   ?rivaroxaban (XARELTO) 20 MG TABS tablet Take 1 tablet (20 mg total) by mouth daily with supper. 06/13/21   Lucrezia Starch, MD  ?rosuvastatin (CRESTOR) 20 MG tablet Take 1 tablet (20 mg total) by mouth daily. 07/07/21   Shirley Friar, PA-C  ?sertraline (ZOLOFT) 50 MG tablet Take 1 tablet (50 mg total) by mouth daily. 07/07/21   Shirley Friar, PA-C  ?   ? ?Allergies    ?Sulfamethoxazole-trimethoprim, Sulfonamide derivatives, and Cefaclor   ? ?Review of Systems   ?Review of Systems  ?Respiratory:  Positive for shortness of breath.   ?Cardiovascular:  Positive for chest pain.  ?See HPI ? ?Physical Exam ?Updated Vital Signs ?BP (!) 89/66   Pulse (!) 110   Temp 98.1 ?F (36.7 ?C) (Oral)   Resp 14   Ht 5\' 4"  (1.626 m)   Wt 90.7 kg   SpO2 93%   BMI 34.33 kg/m?  ?Physical Exam ?Vitals and nursing note reviewed.  ?Constitutional:   ?   General: She is not in acute distress. ?   Appearance: Normal appearance. She is not ill-appearing.  ?HENT:  ?   Head: Normocephalic and atraumatic.  ?Eyes:  ?   General: No scleral icterus. ?   Conjunctiva/sclera: Conjunctivae normal.  ?Cardiovascular:  ?   Rate and Rhythm: Tachycardia present. Rhythm irregular.  ?Pulmonary:  ?   Effort: Pulmonary effort is normal. No respiratory distress.  ?   Breath sounds:  Normal breath sounds.  ?Chest:  ?   Chest wall: No tenderness.  ?Skin: ?   Findings: No rash.  ?Neurological:  ?   Mental Status: She is alert.  ?Psychiatric:     ?   Mood and Affect: Mood normal.  ? ? ?ED Results / Procedures / Treatments   ?Labs ?(all labs ordered are listed, but only abnormal results are displayed) ?Labs Reviewed  ?BASIC METABOLIC PANEL - Abnormal; Notable for the following components:  ?    Result Value  ? Glucose, Bld 192 (*)   ? Creatinine, Ser 1.02 (*)   ? GFR, Estimated 59 (*)   ? All other components within normal limits  ?CBC  ?MAGNESIUM  ? ? ?EKG ?EKG Interpretation ? ?Date/Time:  Friday July 14 2021 10:43:58 EDT ?Ventricular Rate:   143 ?PR Interval:    ?QRS Duration: 90 ?QT Interval:  325 ?QTC Calculation: 502 ?R Axis:   -32 ?Text Interpretation: Atrial fibrillation Left anterior fascicular block Abnormal R-wave progression, late transition Prolonged QT interval Confirmed by Octaviano Glow [phone]) on 07/14/2021 10:46:34 AM ? ?Radiology ?DG Chest Port 1 View ? ?Result Date: 07/14/2021 ?CLINICAL DATA:  SOB (shortness of breath) R06.02 (ICD-10-CM) EXAM: PORTABLE CHEST 1 VIEW COMPARISON:  Chest radiograph July 03, 2021. FINDINGS: Streaky left basilar opacities. No visible pleural effusions or pneumothorax. Cardiomediastinal silhouette is accentuated by AP technique. IMPRESSION: Streaky left basilar opacities, favor atelectasis. Electronically Signed   By: Margaretha Sheffield M.D.   On: 07/14/2021 10:49   ? ?Procedures ?Procedures  ? ?Medications Ordered in ED ?Medications  ?diltiazem (CARDIZEM) 1 mg/mL load via infusion 15 mg (15 mg Intravenous Bolus from Bag 07/14/21 1104)  ?sodium chloride 0.9 % bolus 1,000 mL (1,000 mLs Intravenous New Bag/Given 07/14/21 1134)  ? ? ?ED Course/ Medical Decision Making/ A&P ?Clinical Course as of 07/14/21 1358  ?Fri Jul 14, 2021  ?1047 This is a 71 year old female with recently diagnosed atrial fibrillation in the past year, now on Xarelto for the past month per her estimation, presenting to the ED with A-fib with RVR.  Symptom onset 8 AM this morning.  She reports palpitations and pain between her shoulders, which according to her and her daughter, has been her typical symptoms of rapid A-fib.  She was just discharged from the hospital about 8 days ago after admission to the cardiology service, loading and initiation of Tikosyn, which she is currently taking, also had a coronary CT at that time which showed no severe coronary obstructive disease.  Here in the room she is afebrile, she is in A-fib with a heart rate of approximately 100 5260.  Blood pressure stable.  She is reporting some chest discomfort and shoulder  pain.  This is consistent with her rapid A-fib.  Her EKG does not appear acutely ischemic.  We will attempt rate control with diltiazem, and reach out to cardiology, regarding possible readmission for ablation, which was discussed last time on her last visit. [MT]  ?1127 I spoke with Trish with cardiology who stated they will come down to see the patient [MR]  ?1127 On diltiazem the patient began having long sinus pauses, which his primary has had in the past, her blood pressures began today.  I have asked the nurse to discontinue the diltiazem for now.  I have asked her nurse to place the patient on pacer pads, if she becomes unstable may need cardioversion, and we have contacted Trish from cardiology in the meantime to place a stat consult. [  MT]  ?1129 I woke with cards and their recommendation is to electrical cardiovert the patient [MR]  ?1212 Dr Johnsie Cancel at bedside, cards team to admit pt, she appears to have converted herself back into sinus rhythm now with frequent PACs, may have been having conversion pauses.  Bp stable.  Pt and daughter agreeable for admission. [MT]  ?  ?Clinical Course User Index ?[MR] Geneieve Duell A, PA-C ?[MT] Wyvonnia Dusky, MD  ? ?                        ?Medical Decision Making ?Amount and/or Complexity of Data Reviewed ?Labs: ordered. ? ?Risk ?Decision regarding hospitalization. ? ? ?71 year old female with a past medical history of hypertension, hyperlipidemia, hypothyroidism and atrial fibrillation presented today in A-fib RVR.  She was discharged for this 10 days ago after being admitted for 5 days.  She has been on Tikosyn however this morning felt herself go back into atrial fibrillation.Denies chest pain but reports pressure and discomfort between her shoulder blades.  Says that this is how she has felt each time she has gone into A-fib RVR. ? ?PE: Patient presented with a heart rate in the 150s and an irregular rhythm.  Not diaphoretic, no increase in work of  breathing. ? ?Treatment: Patient was started on diltiazem.  This successfully brought her rate down however as soon as the infusion was begun, she began to have long sinus pauses as noted by the RN.  This happened dur

## 2021-07-14 NOTE — ED Triage Notes (Signed)
Pt arrived POV from home c/o [facility] and CP that started this morning. Pt states I think it is my a-fib acting up. Pt states she feels like her heart is racing.  ?

## 2021-07-14 NOTE — Plan of Care (Signed)
?  Problem: Education: ?Goal: Knowledge of General Education information will improve ?Description: Including pain rating scale, medication(s)/side effects and non-pharmacologic comfort measures ?Outcome: Progressing ?  ?Problem: Health Behavior/Discharge Planning: ?Goal: Ability to manage health-related needs will improve ?Outcome: Progressing ?  ?Problem: Clinical Measurements: ?Goal: Ability to maintain clinical measurements within normal limits will improve ?Outcome: Progressing ?  ?Problem: Clinical Measurements: ?Goal: Cardiovascular complication will be avoided ?Outcome: Progressing ?  ?Problem: Activity: ?Goal: Risk for activity intolerance will decrease ?Outcome: Progressing ?  ?Problem: Pain Managment: ?Goal: General experience of comfort will improve ?Outcome: Progressing ?  ?Problem: Safety: ?Goal: Ability to remain free from injury will improve ?Outcome: Progressing ?  ?

## 2021-07-14 NOTE — ED Provider Notes (Signed)
.  Critical Care ?Performed by: Wyvonnia Dusky, MD ?Authorized by: Wyvonnia Dusky, MD  ? ?Critical care provider statement:  ?  Critical care time (minutes):  40 ?  Critical care was necessary to treat or prevent imminent or life-threatening deterioration of the following conditions:  Circulatory failure ?  Critical care was time spent personally by me on the following activities:  Evaluation of patient's response to treatment, re-evaluation of patient's condition, review of old charts, ordering and review of radiographic studies, ordering and review of laboratory studies and discussions with consultants ?  Care discussed with: admitting provider   ?Comments:  ?   A Fib with RVR rate control, repeat ECG interpretation, telemetry monitoring, cardiology consultation ? ?  ?Wyvonnia Dusky, MD ?07/14/21 1410 ? ?

## 2021-07-14 NOTE — Consult Note (Addendum)
?Cardiology Consultation:  ? ?Patient ID: Kathleen Sellers ?MRN: [id number]; DOB: 1950-08-12 ? ?Admit date: 07/14/2021 ?Date of Consult: 07/14/2021 ? ?PCP:  Mancel Bale, PA-C ?  ?[facility] Providers ?Cardiologist:  Dr. Harrington Challenger ?EP: New to Dr. Quentin Ore last week ? ? ?Patient Profile:  ? ?Kathleen Sellers is a 71 y.o. female with a hx of AFib, HTN, HLD, CAD (non-obstructive by CT), hypothyroidism, new Afib who is being seen 07/14/2021 for the evaluation of recurrent Afib, pauses at the request of Dr. Caryl Comes. ? ?History of Present Illness:  ? ?Ms. Heath of late with a a new diagnosis of Afib with a few ER visits of late more recently admitted 07/02/21 with recurrent Afib noted as well to have post termination pauses as long as 5.4 seconds with symptoms.  EP was consulted for further evaluation.  Dr. Quentin Ore saw her, recommended ablation as best long term management and AADd in the interim.   ?She was started on Tikosyn and discharged 07/06/21. ? ?She returned this AM with recurrent symptoms of her AFib pressure in the back of her shoulder blades and some anterior chest discomfort, and lightheaded, she reported good medicatin compliance. ?In the ER observed to have post conversion pauses and cardiology consulted. ? ?LABS ?K+ 3.9 ?Mag 1.9 ?BUN/Creat 16/1.02 ?WBC 5.7 ?H/H 13/41 ?Plts 204 ? ?TSH ?06/27/21 12.852 ?07/03/21 10.517 ? ?Last admission with same symptoms HS Trop 5, 4, 4 ? ?She is feeling improved, here, when in AFib she feels chest tightness, shoulder blades ache, and a little lightheaded.   ?She has not had syncope, denies near syncope. ?Dizzy though and can last a minute or so ? ? ?Past Medical History:  ?Diagnosis Date  ? CAD (coronary artery disease)   ? Cerebral atrophy ([place]) 02/07/2011  ? done to assess head trauma ( [facility])  ? Colon polyps 2005  ? Depression   ? Hyperglycemia   ? Hyperlipidemia   ? Hypertension   ? Hypothyroidism   ? ? ?Past Surgical History:  ?Procedure Laterality Date  ?  APPENDECTOMY  1970  ? with USO  ? CARDIAC CATHETERIZATION  2003  ? CATARACT EXTRACTION    ? Dr Gershon Crane  ? CHOLECYSTECTOMY  2005  ? COLONOSCOPY W/ POLYPECTOMY  2006  ? Dr. Penelope Coop  ? Hysterectomy & USO  1995  ? NASAL SINUS SURGERY  1988  ? Dr. Ernesto Rutherford  ? OOPHORECTOMY  1970  ? benign tumor  ?  ? ?Home Medications:  ?Prior to Admission medications   ?Medication Sig Start Date End Date Taking? Authorizing Provider  ?acetaminophen (TYLENOL) 500 MG tablet Take 500 mg by mouth every 6 (six) hours as needed (pain).   Yes [provider]  ?dofetilide (TIKOSYN) 250 MCG capsule Take 1 capsule (250 mcg total) by mouth 2 (two) times daily. 07/06/21  Yes Shirley Friar, PA-C  ?isosorbide mononitrate (IMDUR) 30 MG 24 hr tablet Take 1/2 tablet (15 mg total) by mouth daily. ?Patient taking differently: Take 15 mg by mouth every morning. 07/07/21  Yes Shirley Friar, PA-C  ?LORazepam (ATIVAN) 0.5 MG tablet Take 0.5 mg by mouth every morning. 05/08/21  Yes [provider]  ?nitroGLYCERIN (NITROSTAT) 0.4 MG SL tablet Place 1 tablet (0.4 mg total) under the tongue every 5 (five) minutes as needed for chest pain (If more than 3 tabs needed, call 911.). 07/06/21  Yes Tillery, Satira Mccallum, PA-C  ?omeprazole (PRILOSEC) 40 MG capsule Take 40 mg by mouth every morning. 05/29/21  Yes  [provider]  ?rivaroxaban (XARELTO) 20 MG TABS tablet Take 1 tablet (20 mg total) by mouth daily with supper. 06/13/21  Yes Lucrezia Starch, MD  ?rosuvastatin (CRESTOR) 20 MG tablet Take 1 tablet (20 mg total) by mouth daily. ?Patient taking differently: Take 20 mg by mouth daily with supper. 07/07/21  Yes Shirley Friar, PA-C  ?sertraline (ZOLOFT) 50 MG tablet Take 1 tablet (50 mg total) by mouth daily. ?Patient taking differently: Take 50 mg by mouth every morning. 07/07/21  Yes Shirley Friar, PA-C  ? ? ?Inpatient Medications: ?Scheduled Meds: ? ?Continuous Infusions: ? ?PRN Meds: ? ? ?Allergies:     ?Allergies  ?Allergen Reactions  ? Sulfamethoxazole-Trimethoprim Rash  ?   ?  ? Sulfonamide Derivatives Rash  ? Cefaclor Itching and Rash  ? ? ?Social History:   ?Social History  ? ?Socioeconomic History  ? Marital status: Married  ?  Spouse name: Not on file  ? Number of children: 3  ? Years of education: Not on file  ? Highest education level: Not on file  ?Occupational History  ? Occupation: Receptionist - [employer]  ?  Employer: [employer]  ?Tobacco Use  ? Smoking status: Never  ? Smokeless tobacco: Never  ?Substance and Sexual Activity  ? Alcohol use: No  ? Drug use: No  ? Sexual activity: Not on file  ?Other Topics Concern  ? Not on file  ?Social History Narrative  ? No reg exercise  ? ?Social Determinants of Health  ? ?Financial Resource Strain: Not on file  ?Food Insecurity: Not on file  ?Transportation Needs: Not on file  ?Physical Activity: Not on file  ?Stress: Not on file  ?Social Connections: Not on file  ?Intimate Partner Violence: Not on file  ?  ?Family History:   ?Family History  ?Problem Relation Age of Onset  ? Stroke Father   ?     cerebral hemorrhage  ? Diabetes Mother   ? Hypertension Mother   ? Lung cancer Mother   ?     with bone metastases  ? Coronary artery disease Paternal Uncle   ? Breast cancer Maternal Grandmother   ? Heart attack Paternal Grandfather   ?     in 55s  ?  ? ?ROS:  ?Please see the history of present illness.  ?All other ROS reviewed and negative.    ? ?Physical Exam/Data:  ? ?Vitals:  ? 07/14/21 1315 07/14/21 1330 07/14/21 1345 07/14/21 1400  ?BP: 115/68 111/74 (!) 112/58 (!) 116/56  ?Pulse: 76 67 63 66  ?Resp: 18 (!) 22 17 17   ?Temp:      ?TempSrc:      ?SpO2: 94% 96% 95% 92%  ?Weight:      ?Height:      ? ?No intake or output data in the 24 hours ending 07/14/21 1409 ?Last 3 Weights 07/14/2021 07/03/2021 07/03/2021  ?Weight (lbs) 200 lb 202 lb 206 lb 2.1 oz  ?Weight (kg) 90.719 kg 91.627 kg 93.5 kg  ?   ?Body mass index is 34.33 kg/m?.  ?General:  Well  nourished, well developed, in no acute distress ?HEENT: normal ?Neck: no JVD ?Vascular: No carotid bruits; Distal pulses 2+ bilaterally ?Cardiac:  RRR; n2/6 gallops or rubs ?Lungs: B ronchi ?Abd: soft, nontender, no hepatomegaly  ?Ext: no edema ?Musculoskeletal:  No deformities ?Skin: warm and dry  ?Neuro:  no focal abnormalities noted ?Psych:  Normal affect  ? ?EKG:  The EKG was personally reviewed and  demonstrates:   ?AFib 169bpm,  ?AFib 143bpm ? ?Telemetry:  Telemetry was personally reviewed and demonstrates:   ?SR 60's, AFib 120's, no long pauses ? ?Relevant CV Studies: ? ?07/03/21: TTE ? 1. Left ventricular ejection fraction, by estimation, is 60 to 65%. The  ?left ventricle has normal function. The left ventricle has no regional  ?wall motion abnormalities. Severe focal basal septal left ventricular  ?hypertrophy. No significant LV outflow  ?tract gradient noted, no mitral valve systolic anterior motion noted. Left  ?ventricular diastolic parameters are consistent with Grade I diastolic  ?dysfunction (impaired relaxation).  ? 2. Right ventricular systolic function is normal. The right ventricular  ?size is normal. There is normal pulmonary artery systolic pressure. The  ?estimated right ventricular systolic pressure is [id number].  ? 3. The mitral valve is normal in structure. Trivial mitral valve  ?regurgitation. No evidence of mitral stenosis.  ? 4. The aortic valve is tricuspid. There is mild calcification of the  ?aortic valve. Aortic valve regurgitation is trivial. Aortic valve  ?sclerosis/calcification is present, without any evidence of aortic  ?stenosis.  ? 5. The inferior vena cava is normal in size with greater than 50%  ?respiratory variability, suggesting right atrial pressure of 3 mmHg.  ? ? ? 07/05/21; Coronary CT ?IMPRESSION: ?1. Coronary calcium score of 938. This was 97th percentile for age ?and sex matched control. ?  ?2. Normal coronary origin with codominance. ?  ?3. Calcified plaque in the left  main causes minimal (0-24%) stenosis ?  ?4. Calcified plaque in the proximal LAD causes mild (25-49%) ?stenosis. Calcified plaque in D1 causes minimal (0-24%) stenosis. ?  ?5. Calcified plaque in the proximal LCX cause

## 2021-07-14 NOTE — ED Provider Triage Note (Signed)
Emergency Medicine Provider Triage Evaluation Note ? ?Kathleen Sellers , a 71 y.o. female  was evaluated in triage.  Pt complains of feeling of lightheadedness, heart palpitations that began around 815, 830 this morning.  Patient with recent hospitalization for A-fib with RVR started on dofetilide.  Patient reports that she has taken all of her medications as normal.  She endorses some left-sided chest pain along with the heart palpitations.  She denies any nausea, vomiting. ? ?Review of Systems  ?Positive: Chest pain, lightheadedness, heart racing ?Negative: NV ? ?Physical Exam  ?BP 99/74 (BP Location: Right Arm)   Pulse 92   Temp 98.1 ?F (36.7 ?C) (Oral)   Resp 20   Ht 5\' 4"  (1.626 m)   Wt 90.7 kg   SpO2 96%   BMI 34.33 kg/m?  ?Gen:   Awake, no distress   ?Resp:  Normal effort  ?MSK:   Moves extremities without difficulty  ?Other:  Irregularly irreg rhythm, rapid rate ? ?Medical Decision Making  ?Medically screening exam initiated at 10:15 AM.  Appropriate orders placed.  Kathleen Sellers was informed that the remainder of the evaluation will be completed by another provider, this initial triage assessment does not replace that evaluation, and the importance of remaining in the ED until their evaluation is complete. ? ?Workup initiated ?  ?Anselmo Pickler, PA-C ?07/14/21 1016 ? ?

## 2021-07-15 LAB — BASIC METABOLIC PANEL
Anion gap: 6 (ref 5–15)
BUN: 14 mg/dL (ref 8–23)
CO2: 26 mmol/L (ref 22–32)
Calcium: 9 mg/dL (ref 8.9–10.3)
Chloride: 108 mmol/L (ref 98–111)
Creatinine, Ser: 0.87 mg/dL (ref 0.44–1.00)
GFR, Estimated: >60 mL/min (ref >60)
Glucose, Bld: 116 mg/dL — ABNORMAL HIGH (ref 70–99)
Potassium: 4.3 mmol/L (ref 3.5–5.1)
Sodium: 140 mmol/L (ref 135–145)

## 2021-07-15 MED ORDER — SALINE SPRAY 0.65 % NA SOLN
1.0000 | NASAL | Status: DC | PRN
  Administered 2021-07-15: 1 via NASAL
  Filled 2021-07-15: qty 44

## 2021-07-15 NOTE — Progress Notes (Signed)
? ?Progress Note ? ?Patient Name: Kathleen Sellers ?Date of Encounter: 07/15/2021 ? ?Primary Cardiologist: None  ? ?Subjective  ? ?Denies chest pain or shortness of breath.  No palpitations ? ?Inpatient Medications  ?  ?Scheduled Meds: ? amiodarone  400 mg Oral BID  ? LORazepam  0.5 mg Oral q morning  ? pantoprazole  80 mg Oral Daily  ? rivaroxaban  20 mg Oral Q supper  ? rosuvastatin  20 mg Oral QPM  ? sertraline  50 mg Oral Daily  ? ?Continuous Infusions: ? ?PRN Meds: ?acetaminophen, nitroGLYCERIN, sodium chloride  ? ?Vital Signs  ?  ?Vitals:  ? 07/14/21 2002 07/14/21 2359 07/15/21 0408 07/15/21 [id number]  ?BP:  127/72 133/74 133/79  ?Pulse:   (!) 59 62  ?Resp: 18 18 19 16   ?Temp: 97.7 ?F (36.5 ?C) 98.1 ?F (36.7 ?C) 98.1 ?F (36.7 ?C) 98 ?F (36.7 ?C)  ?TempSrc: Oral  Oral Oral  ?SpO2:    98%  ?Weight:      ?Height:      ? ? ?Intake/Output Summary (Last 24 hours) at 07/15/2021 1012 ?Last data filed at 07/15/2021 0700 ?Gross per 24 hour  ?Intake 1240 ml  ?Output 1150 ml  ?Net 90 ml  ? ?Filed Weights  ? 07/14/21 1015  ?Weight: 90.7 kg  ? ? ?Telemetry  ?  ?Normal sinus rhythm.- Personally Reviewed ? ?ECG  ?  ?Normal sinus rhythm- Personally Reviewed ? ?Physical Exam  ? ?GEN: No acute distress.   ?Neck: No JVD ?Cardiac: RRR, no murmurs, rubs, or gallops.  ?Respiratory: Clear to auscultation bilaterally. ?GI: Soft, nontender, non-distended  ?MS: No edema; No deformity. ?Neuro:  Nonfocal  ?Psych: Normal affect  ? ?Labs  ?  ?Chemistry ?Recent Labs  ?Lab 07/14/21 ?1018 07/15/21 ?0345  ?NA 138 140  ?K 3.9 4.3  ?CL 107 108  ?CO2 22 26  ?GLUCOSE 192* 116*  ?BUN 16 14  ?CREATININE 1.02* 0.87  ?CALCIUM 9.2 9.0  ?GFRNONAA 59* >60  ?ANIONGAP 9 6  ?  ? ?Hematology ?Recent Labs  ?Lab 07/14/21 ?1018  ?WBC 5.7  ?RBC 4.81  ?HGB 13.9  ?HCT 41.3  ?MCV 85.9  ?MCH 28.9  ?MCHC 33.7  ?RDW 13.3  ?PLT 204  ? ? ?Cardiac EnzymesNo results for input(s): TROPONINI in the last 168 hours. No results for input(s): TROPIPOC in the last 168 hours.  ? ?BNPNo  results for input(s): BNP, PROBNP in the last 168 hours.  ? ?DDimer No results for input(s): DDIMER in the last 168 hours.  ? ?Radiology  ?  ?DG Chest Port 1 View ? ?Result Date: 07/14/2021 ?CLINICAL DATA:  SOB (shortness of breath) R06.02 (ICD-10-CM) EXAM: PORTABLE CHEST 1 VIEW COMPARISON:  Chest radiograph July 03, 2021. FINDINGS: Streaky left basilar opacities. No visible pleural effusions or pneumothorax. Cardiomediastinal silhouette is accentuated by AP technique. IMPRESSION: Streaky left basilar opacities, favor atelectasis. Electronically Signed   By: Margaretha Sheffield M.D.   On: 07/14/2021 10:49   ? ?Cardiac Studies  ? ?None ? ?Patient Profile  ?   ?71 y.o. female admitted with uncontrolled atrial fibrillation now back to sinus rhythm. ? ?Assessment & Plan  ?  ?1.  Paroxysmal/persistent atrial fibrillation -the patient is maintaining sinus rhythm on amiodarone.  She is quite anxious about going back into A-fib.  We will plan on continuing her amiodarone today 400 mg twice daily and if she is maintaining sinus rhythm, let her go home tomorrow. ? ?For questions or updates, please contact [facility] ?Please consult [email or web address] for contact info under Cardiology/STEMI. ?  ?   ?Signed, ?Cristopher Peru, MD  ?07/15/2021, 10:12 AM    ?

## 2021-07-16 ENCOUNTER — Encounter (HOSPITAL_COMMUNITY): Payer: Self-pay | Admitting: Internal Medicine

## 2021-07-16 ENCOUNTER — Other Ambulatory Visit: Payer: Self-pay | Admitting: Cardiology

## 2021-07-16 DIAGNOSIS — I455 Other specified heart block: Secondary | ICD-10-CM

## 2021-07-16 DIAGNOSIS — I495 Sick sinus syndrome: Secondary | ICD-10-CM

## 2021-07-16 DIAGNOSIS — I480 Paroxysmal atrial fibrillation: Secondary | ICD-10-CM

## 2021-07-16 DIAGNOSIS — G4730 Sleep apnea, unspecified: Secondary | ICD-10-CM

## 2021-07-16 DIAGNOSIS — I4891 Unspecified atrial fibrillation: Secondary | ICD-10-CM

## 2021-07-16 HISTORY — DX: Sleep apnea, unspecified: G47.30

## 2021-07-16 MED ORDER — LORATADINE 10 MG PO TABS: 10.0000 mg | ORAL_TABLET | Freq: Every day | ORAL | 0 refills | Status: AC

## 2021-07-16 MED ORDER — AMIODARONE HCL 200 MG PO TABS: 400.0000 mg | ORAL_TABLET | Freq: Two times a day (BID) | ORAL | 1 refills | Status: AC

## 2021-07-16 MED ORDER — LORATADINE 10 MG PO TABS: 10.0000 mg | ORAL_TABLET | Freq: Every day | ORAL | Status: DC

## 2021-07-16 NOTE — TOC Transition Note (Signed)
Transition of Care (TOC) - CM/SW Discharge Note ? ? ?Patient Details  ?Name: Kathleen Sellers ?MRN: [id number] ?Date of Birth: 04/25/1951 ? ?Transition of Care (TOC) CM/SW Contact:  ?Kermit Balo, RN ?Phone Number: ?07/16/2021, 2:10 PM ? ? ?Clinical Narrative:    ?Patient is from home. She qualified for home oxygen nocturnally only. Patient provided choice and [employer] to deliver oxygen to the home for nocturnal use.  ?Pt has transportation home once discharged.  ? ? ?Final next level of care: Home/Self Care ?Barriers to Discharge: No Barriers Identified ? ? ?Patient Goals and CMS Choice ?  ?  ?Choice offered to / list presented to : Patient ? ?Discharge Placement ?  ?           ?  ?  ?  ?  ? ?Discharge Plan and Services ?  ?  ?           ?DME Arranged: Oxygen ?DME Agency: [employer] ?Date DME Agency Contacted: 07/16/21 ?  ?Representative spoke with at DME Agency: jasmin ?  ?  ?  ?  ?  ? ?Social Determinants of Health (SDOH) Interventions ?  ? ? ?Readmission Risk Interventions ?No flowsheet data found. ? ? ? ? ?

## 2021-07-16 NOTE — Discharge Summary (Addendum)
?Discharge Summary  ?  ?Patient ID: Kathleen Sellers ?MRN: [id number]; DOB: 03/11/1951 ? ?Admit date: 07/14/2021 ?Discharge date: 07/16/2021 ? ?PCP:  Morrell RiddleWeber, Sarah L, PA-C ?  ?[facility] Providers ?Cardiologist:  None      ? ? ?Discharge Diagnoses  ?  ?Principal Problem: ?  Atrial fibrillation (HCC) ?Active Problems: ?  HYPERLIPIDEMIA ?  HTN (hypertension) ?  Paroxysmal atrial fibrillation (HCC) ?  Sleep apnea with hypoxia ?  Sinus pause termination pauses ? ? ? ?Diagnostic Studies/Procedures  ?  ?None this admit ?_____________ ?  ?History of Present Illness   ?  ?Kathleen Sellers is a 71 y.o. female with hx of AFib, HTN, HLD, CAD (non-obstructive by CT), hypothyroidism, new Afib after a few ER visits and 07/02/21 with recurrent a fib with post termination pauses up to 5.4 sec with symptoms.  Plan was for ablation as best long term management and AADd in the interim.  Tikosyn was started on 07/06/21 but with prolonged Qtc she was discharged on 250ug BID.   Also hx of cardiac CTA done 07/05/21 with calcium score 938 CAD RADS 3 with tightest lesion in mid LCX and negative f/u FFR CT. ? ?She presented to ER 07/14/21 with SOB and chest pain and heart racing.  In ER she was given dilt but BP dropped so diltiazem was stopped.  She was admitted for further evaluation and to wash out tikosyn.  Now Tikosyn failure.  ? ? ?Hospital Course  ?   ?Consultants: Dr. Graciela HusbandsKlein EP  ? ?Amiodarone was started after Tikosyn washout. Her xarelto was continued.   Dr. Graciela HusbandsKlein discontinued the isosorbide, he thought the chest pain that she has is a function of rate and in the context of her very rapid atrial fibrillation the benefits of isosorbide are minimized and can use perhaps "the BP reserve" for AV nodal blockade if necessary. ? ?Outpt sleep study recommended she was diagnosed at one point but needs study and treatment.   Data from ablation registry have shown that untreated sleep apnea is significantly attenuating of benefit for A-fib ablation.   It is noted she desats to 80% on RA during the night with sleep.   Due to this sleep apnea hypoxia will add home 02 for sleep. We will arrange sleep study. ? ?HR to 49 at times with some dyspnea.  With ambulation to BR HR to 70.   Amiodarone at 400 mg BID.  Will prescribe 400 mg BID for 1 week then 400 mg daily.  No further termination pauses seen.  Will place Live Zio patch to evaluate and determine a fib burden.   ? ?She has been seen and evaluated by Dr. Ladona Ridgelaylor and found stable for discharge.   ?  ? ?Did the patient have an acute coronary syndrome (MI, NSTEMI, STEMI, etc) this admission?:  No                               ?Did the patient have a percutaneous coronary intervention (stent / angioplasty)?:  No.   ? ?   ? ?No TOC appt   ? ?Discharge Vitals ?Blood pressure 123/65, pulse 62, temperature 98.3 ?F (36.8 ?C), temperature source Oral, resp. rate 16, height 5\' 4"  (1.626 m), weight 90.7 kg, SpO2 95 %.  ?Filed Weights  ? 07/14/21 1015  ?Weight: 90.7 kg  ? ? ?Labs & Radiologic Studies  ?  ?CBC ?Recent Labs  ?  07/14/21 ?  1018  ?WBC 5.7  ?HGB 13.9  ?HCT 41.3  ?MCV 85.9  ?PLT 204  ? ?Basic Metabolic Panel ?Recent Labs  ?  07/14/21 ?1018 07/15/21 ?0345  ?NA 138 140  ?K 3.9 4.3  ?CL 107 108  ?CO2 22 26  ?GLUCOSE 192* 116*  ?BUN 16 14  ?CREATININE 1.02* 0.87  ?CALCIUM 9.2 9.0  ?MG 1.9  --   ? ?Liver Function Tests ?No results for input(s): AST, ALT, ALKPHOS, BILITOT, PROT, ALBUMIN in the last 72 hours. ?No results for input(s): LIPASE, AMYLASE in the last 72 hours. ?High Sensitivity Troponin:   ?Recent Labs  ?Lab 07/02/21 ?1950 07/02/21 ?2218 07/04/21 ?1925 07/05/21 ?5427 07/05/21 ?0850  ?TROPONINIHS 9 9 5 4 4   ?  ?BNP ?Invalid input(s): POCBNP ?D-Dimer ?No results for input(s): DDIMER in the last 72 hours. ?Hemoglobin A1C ?No results for input(s): HGBA1C in the last 72 hours. ?Fasting Lipid Panel ?No results for input(s): CHOL, HDL, LDLCALC, TRIG, CHOLHDL, LDLDIRECT in the last 72 hours. ?Thyroid Function Tests ?No  results for input(s): TSH, T4TOTAL, T3FREE, THYROIDAB in the last 72 hours. ? ?Invalid input(s): FREET3 ?_____________  ?DG Chest 1 View ? ?Result Date: 06/17/2021 ?CLINICAL DATA:  Chest pain.  History of atrial fibrillation. EXAM: CHEST  1 VIEW COMPARISON:  06/13/2021 FINDINGS: Heart size and mediastinal contours are unremarkable. No pleural effusion or edema identified. No airspace densities. IMPRESSION: No acute cardiopulmonary abnormalities. Electronically Signed   By: 06/15/2021 M.D.   On: 06/17/2021 06:44  ? ?CT CORONARY MORPH W/CTA COR W/SCORE W/CA W/CM &/OR WO/CM ? ?Addendum Date: 07/05/2021   ?ADDENDUM REPORT: 07/05/2021 15:58 CLINICAL DATA:  63F with shoulder pain EXAM: Cardiac/Coronary CTA TECHNIQUE: The patient was scanned on a 09/04/2021. FINDINGS: A 100 kV prospective scan was triggered in the descending thoracic aorta at 111 HU's. Axial non-contrast 3 mm slices were carried out through the heart. The data set was analyzed on a dedicated work station and scored using the Agatson method. Gantry rotation speed was 250 msecs and collimation was .6 mm. 0.8 mg of sl NTG was given. The 3D data set was reconstructed in 5% intervals of the 35-75 % of the R-R cycle. Diastolic phases were analyzed on a dedicated work station using MPR, MIP and VRT modes. The patient received 80 cc of contrast. Coronary Arteries:  Normal coronary origin.  Codominance. RCA is a large codominant artery that gives rise to PDA and PLA. Calcified plaque in the proximal RCA causes 25-49% stenosis. Calcified plaque in the mid RCA causes 25-49% stenosis. Left main is a large artery that gives rise to LAD and LCX arteries. Calcified plaque in the left main causes 0-24% stenosis LAD is a large vessel. Calcified plaque in the proximal LAD causes 25-49% stenosis. Calcified plaque in D1 causes 0-24%. LCX is a codominant artery that gives rise to one large OM1 branch. Calcified plaque in the proximal LCX causes 25-49% stenosis.  Calcified plaque in the mid LCX causes 50-69% stenos Calcified plaque in ostial OM1 causes 25-49% stenosis Other findings: Left Ventricle: Normal size Left Atrium: Normal size Pulmonary Veins: Normal configuration Right Ventricle: Mild enlargement Right Atrium: Normal size Cardiac valves: Mild AV calcifications (AV calcium score 86). Mild mitral annular calcifications. Thoracic aorta: Normal size Pulmonary Arteries: Normal size Systemic Veins: Normal drainage Pericardium: Normal thickness IMPRESSION: 1. Coronary calcium score of 938. This was 97th percentile for age and sex matched control. 2. Normal coronary origin with codominance. 3. Calcified plaque in the  left main causes minimal (0-24%) stenosis 4. Calcified plaque in the proximal LAD causes mild (25-49%) stenosis. Calcified plaque in D1 causes minimal (0-24%) stenosis. 5. Calcified plaque in the proximal LCX causes mild (25-49%) stenosis. Calcified plaque in the mid LCX causes moderate (50-69%) stenosis. Calcified plaque in ostial OM1 causes mild (25-49%) stenosis 6. Calcified plaque in the proximal and mid RCA causes mild (25-49%) stenosis. 7.  Mild aortic valve calcifications 8.  Will send study for CTFFR CAD-RADS 3. Moderate stenosis. Consider symptom-guided anti-ischemic pharmacotherapy as well as risk factor modification per guideline directed care. Additional analysis with CT FFR will be submitted. Electronically Signed   By: Epifanio Lesches M.D.   On: 07/05/2021 15:58  ? ?Result Date: 07/05/2021 ?EXAM: OVER-READ INTERPRETATION  CT CHEST The following report is an over-read performed by radiologist Dr. Jeronimo Greaves of [facility] on 07/05/2021. This over-read does not include interpretation of cardiac or coronary anatomy or pathology. The coronary CTA interpretation by the cardiologist is attached. COMPARISON:  Chest radiograph of 07/03/2021. FINDINGS: Vascular: Aortic atherosclerosis. No central pulmonary embolism, on this non-dedicated  study. Mediastinum/Nodes: No imaged thoracic adenopathy. Moderate hiatal hernia, with approximately 1/3 of the stomach in the lower chest. Lungs/Pleura: No pleural fluid.  Clear imaged lungs. [place]

## 2021-07-16 NOTE — Progress Notes (Signed)
During night, O2 sat decreased to 80% on room air with known hx OSA per pt report.  States she has not worn CPAP in approx 10 years.   O2 at 2L per [place] applied with sats maintaining 95% and above while asleep on oxygen.  0500, pt reported she had a feeling of shortness of breath and looked up to see her heart rate was 49 and asked if that was why.  Pt denies shortness of breath during reporting of the event which occurred at 0457 upon looking at strips to eval event timing.  HR currently at 58 at rest and 70s with ambulation to bathroom.  Patient has no c/o at present. ?

## 2021-07-16 NOTE — Progress Notes (Signed)
? ?Progress Note ? ?Patient Name: Kathleen Sellers ?Date of Encounter: 07/16/2021 ? ?Primary Cardiologist: None  ? ?Subjective  ? ?Anxious about going home. Note drop in sats while sleeping.  ? ?Inpatient Medications  ?  ?Scheduled Meds: ? amiodarone  400 mg Oral BID  ? LORazepam  0.5 mg Oral q morning  ? pantoprazole  80 mg Oral Daily  ? rivaroxaban  20 mg Oral Q supper  ? rosuvastatin  20 mg Oral QPM  ? sertraline  50 mg Oral Daily  ? ?Continuous Infusions: ? ?PRN Meds: ?acetaminophen, nitroGLYCERIN, sodium chloride  ? ?Vital Signs  ?  ?Vitals:  ? 07/15/21 2100 07/16/21 0004 07/16/21 0432 07/16/21 0739  ?BP: 125/75 134/66 133/72 114/73  ?Pulse: (!) 58 (!) 56 (!) 55 (!) 55  ?Resp:  16 16 16   ?Temp:  97.6 ?F (36.4 ?C) 97.8 ?F (36.6 ?C) 97.8 ?F (36.6 ?C)  ?TempSrc:  Axillary Axillary Oral  ?SpO2: 94% 94% 97% 92%  ?Weight:      ?Height:      ? ? ?Intake/Output Summary (Last 24 hours) at 07/16/2021 1158 ?Last data filed at 07/15/2021 2000 ?Gross per 24 hour  ?Intake 417 ml  ?Output --  ?Net 417 ml  ? ?Filed Weights  ? 07/14/21 1015  ?Weight: 90.7 kg  ? ? ?Telemetry  ?  ?NSR - Personally Reviewed ? ?ECG  ?  ?none - Personally Reviewed ? ?Physical Exam  ? ?GEN: No acute distress.   ?Neck: No JVD ?Cardiac: RRR, no murmurs, rubs, or gallops.  ?Respiratory: Clear to auscultation bilaterally. ?GI: Soft, nontender, non-distended  ?MS: No edema; No deformity. ?Neuro:  Nonfocal  ?Psych: Normal affect  ? ?Labs  ?  ?Chemistry ?Recent Labs  ?Lab 07/14/21 ?1018 07/15/21 ?0345  ?NA 138 140  ?K 3.9 4.3  ?CL 107 108  ?CO2 22 26  ?GLUCOSE 192* 116*  ?BUN 16 14  ?CREATININE 1.02* 0.87  ?CALCIUM 9.2 9.0  ?GFRNONAA 59* >60  ?ANIONGAP 9 6  ?  ? ?Hematology ?Recent Labs  ?Lab 07/14/21 ?1018  ?WBC 5.7  ?RBC 4.81  ?HGB 13.9  ?HCT 41.3  ?MCV 85.9  ?MCH 28.9  ?MCHC 33.7  ?RDW 13.3  ?PLT 204  ? ? ?Cardiac EnzymesNo results for input(s): TROPONINI in the last 168 hours. No results for input(s): TROPIPOC in the last 168 hours.  ? ?BNPNo results for  input(s): BNP, PROBNP in the last 168 hours.  ? ?DDimer No results for input(s): DDIMER in the last 168 hours.  ? ?Radiology  ?  ?No results found. ? ?Cardiac Studies  ? ?none ? ?Patient Profile  ?   ?70 y.o. female with PAF and pauses and sleep apnea ? ?Assessment & Plan  ?  ?PAF - she is maintaining NSR. I would like her to stay on 400 bid for a week then 400 mg daily and then followup with an EP PA or Dr. 66 for atrial fib ablation.  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 Hopefully she will maintain NSR on amiodarone. ?Sleep apnea - she has been non-compliant for 10 years. I encouraged her to be re-evaluated. ?Nocturnal desaturation - her sats dropped last night. They were better with supplemental oxygen. I would like her to be discharged home on 2 liter of N/C.  ? ?For questions or updates, please contact [facility] ?Please consult [email or web address] for contact info under Cardiology/STEMI. ?  ?Signed, ?Lalla Brothers, MD  ?07/16/2021, 11:58 AM    ?

## 2021-07-16 NOTE — Discharge Instructions (Addendum)
Heart Healthy diet  ? ?Do not stop xarelto.   ? ?We stopped Tikosyn and you are now on amiodarone.  Take 2 of 200 mg tabs twice a day for 1 week then beginning  07/23/21 only take 2 tabs once a day.  This dose may be adjusted on next visit.    ? ?Your oxygen level drops at night so we are adding oxygen  this may help prevent atrial fib.  During the day your oxygen level is stable- the sleep apnea is an issue the office will call you about a sleep study and then follow up with our sleep physician Dr. Mayford Knife.  ? ?Our office will add a monitor for you to wear for 2 weeks.  The office should call you about that on Monday or Tuesday.   ? ?Call the office if any problems or questions.    ? ? ?

## 2021-07-17 ENCOUNTER — Other Ambulatory Visit: Payer: Self-pay | Admitting: Cardiology

## 2021-07-17 ENCOUNTER — Ambulatory Visit: Payer: PRIVATE HEALTH INSURANCE

## 2021-07-17 DIAGNOSIS — I4891 Unspecified atrial fibrillation: Secondary | ICD-10-CM

## 2021-07-17 DIAGNOSIS — I480 Paroxysmal atrial fibrillation: Secondary | ICD-10-CM

## 2021-07-17 NOTE — Progress Notes (Signed)
Enrolled for [employer] to mail a ZIO AT Live Telemetry monitor to patients address on file.  ? ?Dr. Lalla Brothers to read. ? ?08/07/21 Zio AT monitor was returned to company unused.  ?

## 2021-07-27 ENCOUNTER — Other Ambulatory Visit (HOSPITAL_COMMUNITY): Payer: Self-pay

## 2021-07-29 ENCOUNTER — Telehealth: Payer: Self-pay | Admitting: *Deleted

## 2021-07-29 DIAGNOSIS — G4733 Obstructive sleep apnea (adult) (pediatric): Secondary | ICD-10-CM

## 2021-07-29 NOTE — Telephone Encounter (Signed)
-----   Message from Leone Brand, NP sent at 07/16/2021 12:02 PM EDT ----- ?Pt with sleep apnea needs sleep study - we are discharging on 02 2L [place] for now but has sleep apnea with desat.   She will need follow up with dr. Mayford Knife.  Thanks. If you place order I will sign.  ? ?

## 2021-08-07 NOTE — Telephone Encounter (Signed)
Split night study ordered per Nada Boozer. ?

## 2021-08-08 NOTE — Telephone Encounter (Signed)
TURNER READ-APPROVED ?NO PA REQUIRED PER TINA S. 08/08/21. ?

## 2021-08-11 ENCOUNTER — Ambulatory Visit: Payer: PRIVATE HEALTH INSURANCE | Admitting: Student

## 2021-08-25 ENCOUNTER — Ambulatory Visit: Payer: PRIVATE HEALTH INSURANCE | Admitting: Student

## 2021-08-31 ENCOUNTER — Encounter: Payer: Self-pay | Admitting: *Deleted

## 2021-08-31 DIAGNOSIS — Z006 Encounter for examination for normal comparison and control in clinical research program: Secondary | ICD-10-CM

## 2021-08-31 NOTE — Research (Signed)
Spoke with Ms Valeri about essence research. States that she would like more information. Emailled her a copy of the consent to review. Encouraged he to call with any questions.  ?

## 2021-09-11 ENCOUNTER — Other Ambulatory Visit: Payer: Self-pay

## 2021-09-11 ENCOUNTER — Ambulatory Visit (HOSPITAL_BASED_OUTPATIENT_CLINIC_OR_DEPARTMENT_OTHER): Payer: PRIVATE HEALTH INSURANCE | Attending: Physician Assistant | Admitting: Cardiology

## 2021-09-11 VITALS — Ht 64.0 in | Wt 200.0 lb

## 2021-09-11 DIAGNOSIS — G4733 Obstructive sleep apnea (adult) (pediatric): Secondary | ICD-10-CM | POA: Diagnosis not present

## 2021-09-11 DIAGNOSIS — I10 Essential (primary) hypertension: Secondary | ICD-10-CM | POA: Insufficient documentation

## 2021-09-11 DIAGNOSIS — G4736 Sleep related hypoventilation in conditions classified elsewhere: Secondary | ICD-10-CM | POA: Diagnosis not present

## 2021-09-12 NOTE — Procedures (Signed)
? ?Patient Name: Kathleen Sellers, Kathleen Sellers ?Study Date: 09/11/2021 ?Gender: Female ?D.O.B: Jul 01, 1950 ?Age (years): 72 ?Referring Provider: Malka So PA ?Height (inches): 64 ?Interpreting Physician: Fransico Him MD, ABSM ?Weight (lbs): 200 ?RPSGT: Steffey, Lennette Bihari ?BMI: 34 ?MRN: [id number] ?Neck Size: 15.50 ? ?CLINICAL INFORMATION ?Sleep Study Type: Split Night CPAP ? ?Indication for sleep study: Hypertension, OSA ? ?Epworth Sleepiness Score: ? ?SLEEP STUDY TECHNIQUE ?As per the AASM Manual for the Scoring of Sleep and Associated Events v2.3 (April 2016) with a hypopnea requiring 4% desaturations. ? ?The channels recorded and monitored were frontal, central and occipital EEG, electrooculogram (EOG), submentalis EMG (chin), nasal and oral airflow, thoracic and abdominal wall motion, anterior tibialis EMG, snore microphone, electrocardiogram, and pulse oximetry. Continuous positive airway pressure (CPAP) was initiated when the patient met split night criteria and was titrated according to treat sleep-disordered breathing. ? ?MEDICATIONS ?Medications self-administered by patient taken the night of the study : N/A ? ?RESPIRATORY PARAMETERS ?Diagnostic ?Total AHI (/hr): 36.8  ?RDI (/hr):56.9  ?OA Index (/hr): 2.6  ?CA Index (/hr): 0.0 ?REM AHI (/hr): N/A  ?NREM AHI (/hr):36.8  ?Supine AHI (/hr):N/A  ?Non-supine AHI (/hr): 36.8 ?Min O2 Sat (%):82.0  ?Mean O2 (%): 91.5  ?Time below 88% (min):23.7  ? ?Titration ?Optimal Pressure (cm):16  ?AHI at Optimal Pressure (/hr):0  ?Min O2 at Optimal Pressure (%):93.0 ?Supine % at Optimal (%):100  ?Sleep % at Optimal (%):94  ? ?SLEEP ARCHITECTURE ?The recording time for the entire night was 381 minutes. ? ?During a baseline period of 179.0 minutes, the patient slept for 137.0 minutes in REM and nonREM, yielding a sleep efficiency of 76.5%. Sleep onset after lights out was 8.8 minutes with a REM latency of N/A minutes. The patient spent 4.7% of the night in stage N1 sleep, 95.3% in stage N2  sleep, 0.0% in stage N3 and 0% in REM. ? ?During the titration period of 201.0 minutes, the patient slept for 189.0 minutes in REM and nonREM, yielding a sleep efficiency of 94.0%. Sleep onset after CPAP initiation was 3.8 minutes with a REM latency of 19.0 minutes. The patient spent 2.6% of the night in stage N1 sleep, 43.4% in stage N2 sleep, 0.%% in stage N3 and 54% in REM. ? ?CARDIAC DATA ?The 2 lead EKG demonstrated sinus rhythm. The mean heart rate was 100.0 beats per minute. Other EKG findings include: None. ?LEG MOVEMENT DATA ?The total Periodic Limb Movements of Sleep (PLMS) were 0. The PLMS index was 0.0 . ? ?IMPRESSIONS ?- Severe obstructive sleep apnea occurred during the diagnostic portion of the study (AHI = 36.8/hour). An optimal PAP pressure was selected for this patient ( 16 cm of water) ?- No significant central sleep apnea occurred during the diagnostic portion of the study (CAI = 0.0/hour). ?- Mild oxygen desaturation was noted during the diagnostic portion of the study (Min O2 = 82.0%). ?- The patient snored with loud snoring volume during the diagnostic portion of the study. ?- No cardiac abnormalities were noted during this study. ?- Clinically significant periodic limb movements did not occur during sleep. ? ?DIAGNOSIS ?- Obstructive Sleep Apnea (G47.33) ?- Nocturnal Hypoxemia ? ?RECOMMENDATIONS ?- Trial of CPAP therapy on 16 cm H2O with a Small size Fisher&Paykel Full Face Simplus mask and heated humidification. ?- Avoid alcohol, sedatives and other CNS depressants that may worsen sleep apnea and disrupt normal sleep architecture. ?- Sleep hygiene should be reviewed to assess factors that may improve sleep quality. ?- Weight management and regular exercise should be initiated  or continued. ?- Return to Sleep Center for re-evaluation after 6 weeks of therapy ? ?[Electronically signed] 09/12/2021 09:49 PM ? ?Fransico Him MD, ABSM ?Diplomate, Tax adviser of Sleep Medicine ?

## 2021-09-19 ENCOUNTER — Ambulatory Visit: Payer: PRIVATE HEALTH INSURANCE | Admitting: Cardiology

## 2021-09-21 ENCOUNTER — Telehealth: Payer: Self-pay | Admitting: *Deleted

## 2021-09-21 DIAGNOSIS — G4733 Obstructive sleep apnea (adult) (pediatric): Secondary | ICD-10-CM

## 2021-09-21 NOTE — Telephone Encounter (Signed)
The patient has been notified of the result and verbalized understanding.  All questions (if any) were answered. Latrelle Dodrill, CMA 09/21/2021 2:07 PM    Upon patient request DME selection is [facility] Patient understands he will be contacted by [facility] to set up his cpap. Patient understands to call if [facility] does not contact him with new setup in a timely manner. Patient understands they will be called once confirmation has been received from [facility] that they have received their new machine to schedule 10 week follow up appointment.   [facility] notified of new cpap order  Please add to airview Patient was grateful for the call and thanked me.

## 2021-09-21 NOTE — Telephone Encounter (Signed)
-----   Message from Gaynelle Cage, CMA sent at 09/14/2021  8:00 AM EDT -----  ----- Message ----- From: Quintella Reichert, MD Sent: 09/12/2021   9:54 PM EDT To: Cv Div Sleep Studies  Please let patient know that they have sleep apnea with successful PAP titration.  Please place order in Epic for ResMed CPAP at 16cm H2O with heated humidity and mask of choice. Followup in 6 weeks after starting PAP therapy

## 2021-10-24 ENCOUNTER — Telehealth: Payer: Self-pay | Admitting: Internal Medicine

## 2021-10-24 DIAGNOSIS — G4733 Obstructive sleep apnea (adult) (pediatric): Secondary | ICD-10-CM

## 2021-11-02 ENCOUNTER — Telehealth: Payer: Self-pay | Admitting: Internal Medicine

## 2021-11-02 NOTE — Telephone Encounter (Signed)
Returned call to patient.   She states [facility] is not able to use the order last sent to them to pick-up oxygen from patient's home.  Patient states, per [facility], order needs to state "Discharge Oxygen."

## 2021-11-02 NOTE — Telephone Encounter (Signed)
Patient understands Dr Mayford Knife has ordered a ONO on cpap for her to test her oxygen at night to see where her oxygen is at before discharging her off the oxygen. The order has been sent to [facility].

## 2021-11-02 NOTE — Telephone Encounter (Signed)
Pt states that [facility] did received order but will not except it because it has to say "Discharge Oxygen". Pt would like a callback. Please advise

## 2021-11-17 ENCOUNTER — Telehealth: Payer: Self-pay | Admitting: Internal Medicine

## 2021-11-17 NOTE — Telephone Encounter (Signed)
Patient called to follow-up on orders to have the oxygen picked up as she has finished the testing.  Patient noted she did not want to continue paying for the oxygen if she is not using it.

## 2021-12-01 NOTE — Telephone Encounter (Signed)
Patient was calling back for update. She stated no one has called her. Please advise

## 2021-12-04 NOTE — Telephone Encounter (Signed)
To Dr Mayford Knife: ONO is in Epic for your viewing. Patient needs order Discharging her off the oxygen.

## 2021-12-04 NOTE — Telephone Encounter (Signed)
Done

## 2021-12-04 NOTE — Telephone Encounter (Signed)
Minimal nocturnal hypoxemia on CPAP - ok to d/c O2  

## 2021-12-04 NOTE — Addendum Note (Signed)
Addended by: Reesa Chew on: 12/04/2021 05:36 PM   Modules accepted: Orders

## 2021-12-04 NOTE — Telephone Encounter (Signed)
Minimal nocturnal hypoxemia on CPAP - ok to d/c O2

## 2021-12-04 NOTE — Telephone Encounter (Signed)
Patient is waiting for result of her ONO so she can get Discharged off the oxygen. It is costing her $167 a month to sit in her home and she into using it.

## 2021-12-04 NOTE — Telephone Encounter (Signed)
Order sent to [facility] via community message. 

## 2021-12-04 NOTE — Telephone Encounter (Signed)
To Dr Mayford Knife: ONO is in Epic for your viewing. Patient needs order Discharging her off the oxygen. Please advise

## 2022-01-02 ENCOUNTER — Ambulatory Visit: Payer: PRIVATE HEALTH INSURANCE | Attending: Cardiology | Admitting: Cardiology

## 2022-01-02 ENCOUNTER — Encounter: Payer: Self-pay | Admitting: Cardiology

## 2022-01-02 VITALS — HR 69 | Ht 64.0 in | Wt 210.0 lb

## 2022-01-02 DIAGNOSIS — G4733 Obstructive sleep apnea (adult) (pediatric): Secondary | ICD-10-CM | POA: Diagnosis not present

## 2022-01-02 NOTE — Progress Notes (Signed)
SLEEP MEDICINE VIRTUAL CONSULT NOTE via Video Note   Because of Kathleen Sellers's co-morbid illnesses, she is at least at moderate risk for complications without adequate follow up.  This format is felt to be most appropriate for this patient at this time.  All issues noted in this document were discussed and addressed.  A limited physical exam was performed with this format.  Please refer to the patient's chart for her consent to telehealth for [facility].      Date:  01/02/2022   ID:  Kathleen Sellers, DOB 1950-08-10, MRN [id number] The patient was identified using 2 identifiers.  Patient Location: Home Provider Location: Home Office   PCP:  Larkin Ina   [facility] Providers Cardiologist:  Lewayne Bunting, MD  Sleep Medicine:  Armanda Magic, MD     Evaluation Performed:  New Patient Evaluation  Chief Complaint: Obstructive sleep apnea  History of Present Illness:    Kathleen Sellers is a 71 y.o. female who is being seen today for the evaluation of obstructive sleep apnea at the request of Lewayne Bunting, MD.  Kathleen Sellers is a 71 y.o. female with a history of CAD, depression, hyperlipidemia, hypertension and a history of obstructive sleep apnea but has not been on CPAP in over 10 years.  She had a hospitalization back in March for atrial fibrillation and was noted to have nocturnal desaturations.  She was discharged home on 2 L of nasal cannula oxygen at night.  She is not been on CPAP in at least 10 years.  She tells me that she could not tolerate the FFM years ago and could not tolerate it.   She was referred for split-night study which was done 09/11/2021 and revealed severe obstructive sleep apnea with an AHI of 36.8/h but no significant central sleep apnea.  She had loud snoring as well.  She underwent CPAP titration to 16 cm H2O.  She is now here for sleep evaluation.  She is doing wonderful with her PAP device and thinks that she has  gotten used to it!!  She tolerates the mask and feels the pressure is adequate.  Since going on PAP she feels rested in the am and has no significant daytime sleepiness.  She denies any significant mouth or nasal dryness or nasal congestion.  She does not think that she snores.    Past Medical History:  Diagnosis Date   CAD (coronary artery disease)    Cerebral atrophy (HCC) 02/07/2011   done to assess head trauma ( [facility])   Colon polyps 2005   Depression    Hyperglycemia    Hyperlipidemia    Hypertension    Hypothyroidism    Sleep apnea with hypoxia 07/16/2021   Past Surgical History:  Procedure Laterality Date   APPENDECTOMY  1970   with USO   CARDIAC CATHETERIZATION  2003   CATARACT EXTRACTION     Dr Nile Riggs   CHOLECYSTECTOMY  2005   COLONOSCOPY W/ POLYPECTOMY  2006   Dr. Evette Cristal   Hysterectomy & USO  1995   NASAL SINUS SURGERY  1988   Dr. Haroldine Laws   OOPHORECTOMY  1970   benign tumor     Current Meds  Medication Sig   acetaminophen (TYLENOL) 500 MG tablet Take 500 mg by mouth every 6 (six) hours as needed (pain).   amiodarone (PACERONE) 200 MG tablet Take 2 tablets (400 mg total) by mouth 2 (two) times  daily. Take 2 tabs twice a day for 1 week and beginning 07/23/21 only take 2 tabs daily in the morning.   loratadine (CLARITIN) 10 MG tablet Take 1 tablet (10 mg total) by mouth daily.   LORazepam (ATIVAN) 0.5 MG tablet Take 0.5 mg by mouth every morning.   nitroGLYCERIN (NITROSTAT) 0.4 MG SL tablet Place 1 tablet (0.4 mg total) under the tongue every 5 (five) minutes as needed for chest pain (If more than 3 tabs needed, call 911.).   omeprazole (PRILOSEC) 40 MG capsule Take 40 mg by mouth every morning.   rivaroxaban (XARELTO) 20 MG TABS tablet Take 1 tablet (20 mg total) by mouth daily with supper.   rosuvastatin (CRESTOR) 20 MG tablet Take 1 tablet (20 mg total) by mouth daily. (Patient taking differently: Take 20 mg by mouth daily with supper.)   sertraline  (ZOLOFT) 50 MG tablet Take 1 tablet (50 mg total) by mouth daily. (Patient taking differently: Take 50 mg by mouth every morning.)     Allergies:   Sulfamethoxazole-trimethoprim, Sulfonamide derivatives, and Cefaclor   Social History   Tobacco Use   Smoking status: Never   Smokeless tobacco: Never  Substance Use Topics   Alcohol use: No   Drug use: No     Family Hx: The patient's family history includes Breast cancer in her maternal grandmother; Coronary artery disease in her paternal uncle; Diabetes in her mother; Heart attack in her paternal grandfather; Hypertension in her mother; Lung cancer in her mother; Stroke in her father.  ROS:   Please see the history of present illness.     All other systems reviewed and are negative.   Prior Sleep studies:   The following studies were reviewed today:  Split-night sleep study and PAP compliance download  Labs/Other Tests and Data Reviewed:     Recent Labs: 06/17/2021: B Natriuretic Peptide 127.3 07/03/2021: TSH 10.517 07/14/2021: Hemoglobin 13.9; Magnesium 1.9; Platelets 204 07/15/2021: BUN 14; Creatinine, Ser 0.87; Potassium 4.3; Sodium 140    Wt Readings from Last 3 Encounters:  01/02/22 210 lb (95.3 kg)  09/11/21 200 lb (90.7 kg)  07/14/21 200 lb (90.7 kg)     Risk Assessment/Calculations:    CHA2DS2-VASc Score = 4   This indicates a 4.8% annual risk of stroke. The patient's score is based upon: CHF History: 0 HTN History: 1 Diabetes History: 0 Stroke History: 0 Vascular Disease History: 1 Age Score: 1 Gender Score: 1         Objective:    Vital Signs:  Pulse 69   Ht 5\' 4"  (1.626 m)   Wt 210 lb (95.3 kg)   BMI 36.05 kg/m    VITAL SIGNS:  reviewed GEN:  no acute distress EYES:  sclerae anicteric, EOMI - Extraocular Movements Intact RESPIRATORY:  normal respiratory effort, symmetric expansion CARDIOVASCULAR:  no peripheral edema SKIN:  no rash, lesions or ulcers. MUSCULOSKELETAL:  no obvious  deformities. NEURO:  alert and oriented x 3, no obvious focal deficit PSYCH:  normal affect  ASSESSMENT & PLAN:    OSA - The patient is tolerating PAP therapy well without any problems. The PAP download performed by his DME was personally reviewed and interpreted by me today and showed an AHI of 2.2 /hr on 16 cm H2O with 100% compliance in using more than 4 hours nightly.  The patient has been using and benefiting from PAP use and will continue to benefit from therapy.  -I will get an overnight pulse oximetry on  CPAP to make sure she does not have residual nocturnal hypoxemia  Time:   Today, I have spent 15 minutes with the patient with telehealth technology discussing the above problems.     Medication Adjustments/Labs and Tests Ordered: Current medicines are reviewed at length with the patient today.  Concerns regarding medicines are outlined above.   Tests Ordered: No orders of the defined types were placed in this encounter.   Medication Changes: No orders of the defined types were placed in this encounter.   Follow Up:  In Person in 1 year(s)  Signed, Armanda Magic, MD  01/02/2022 3:48 PM    [facility]

## 2022-01-02 NOTE — Patient Instructions (Signed)
Medication Instructions:  Your physician recommends that you continue on your current medications as directed. Please refer to the Current Medication list given to you today.  *If you need a refill on your cardiac medications before your next appointment, please call your pharmacy*  Follow-Up: At [facility], you and your health needs are our priority.  As part of our continuing mission to provide you with exceptional heart care, we have created designated Provider Care Teams.  These Care Teams include your primary Cardiologist (physician) and Advanced Practice Providers (APPs -  Physician Assistants and Nurse Practitioners) who all work together to provide you with the care you need, when you need it.  Your next appointment:   1 year(s)  The format for your next appointment:   In Person  Provider:   Traci Turner, MD     Important Information About Sugar       

## 2022-01-31 ENCOUNTER — Telehealth: Payer: Self-pay | Admitting: Cardiology

## 2022-01-31 NOTE — Telephone Encounter (Signed)
Patient stated she needs a replacement part for her CPAP mask.  The patient stated that the other parts ordered do not fit and the mask is losing air.

## 2022-02-01 NOTE — Telephone Encounter (Signed)
Reached out to patient and gave her the number to resupply to get the right parts she needs. Patient was grateful for the call.

## 2022-07-23 ENCOUNTER — Other Ambulatory Visit: Payer: Self-pay | Admitting: Physician Assistant

## 2022-07-23 DIAGNOSIS — Z1231 Encounter for screening mammogram for malignant neoplasm of breast: Secondary | ICD-10-CM

## 2022-10-03 ENCOUNTER — Ambulatory Visit: Payer: PRIVATE HEALTH INSURANCE

## 2022-10-25 ENCOUNTER — Ambulatory Visit
Admission: RE | Admit: 2022-10-25 | Discharge: 2022-10-25 | Disposition: A | Discharge status: Home / Self Care | Payer: PRIVATE HEALTH INSURANCE | Source: Ambulatory Visit | Attending: Physician Assistant | Admitting: Physician Assistant

## 2022-10-25 DIAGNOSIS — Z1231 Encounter for screening mammogram for malignant neoplasm of breast: Secondary | ICD-10-CM

## 2022-11-09 IMAGING — CT CT HEART MORP W/ CTA COR W/ SCORE W/ CA W/CM &/OR W/O CM
1 series · 4 of 6 positions shown, 5 images · non-contrast
Comparison: Chest radiograph of 07/03/2021.
COMPARISON: Chest radiograph of 07/03/2021.

Addendum:
EXAM:
OVER-READ INTERPRETATION  CT CHEST

The following report is an over-read performed by radiologist Dr.
Jan Benny Joost [[email or web address]] on 07/05/2021. This over-read
does not include interpretation of cardiac or coronary anatomy or
pathology. The coronary CTA interpretation by the cardiologist is
attached.
CLINICAL DATA: 70F with shoulder pain
Cardiac/Coronary CTA
TECHNIQUE: The patient was scanned on a [employer] Force scanner.

[Series 2572: coronaries · 4 of 6 slices shown, 5 images]
[im 2/6  vessel]
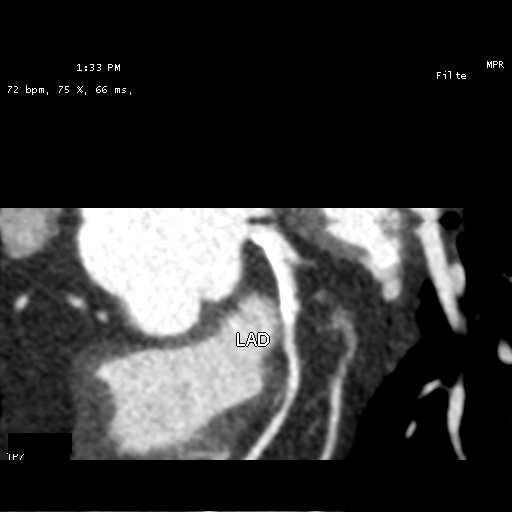
[im 2/6  lung]
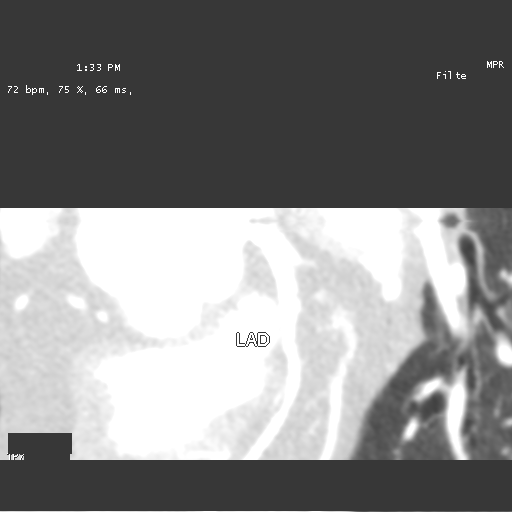
[im 3/6  vessel]
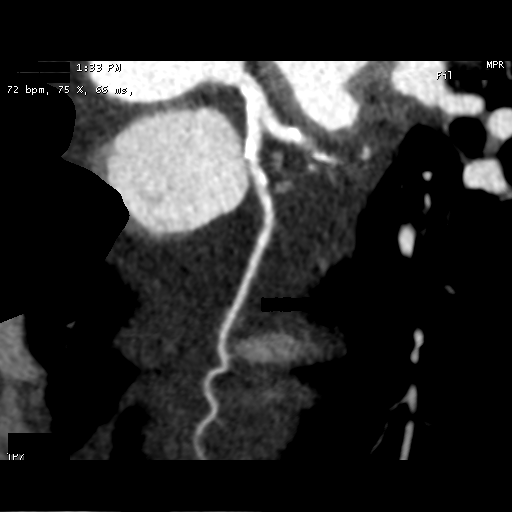
[im 4/6  vessel]
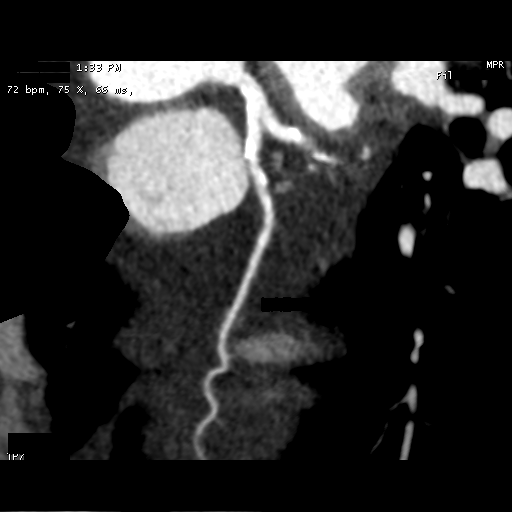
[im 5/6  vessel]
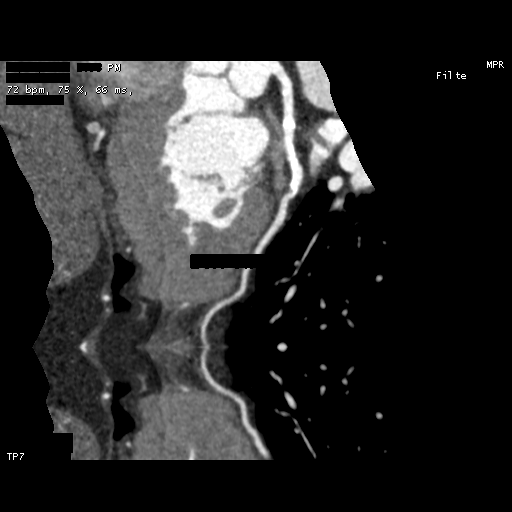

[4 of 6 positions shown; findings below may reference images not displayed]

FINDINGS: Vascular: Aortic atherosclerosis. No central pulmonary embolism, on
this non-dedicated study.

Mediastinum/Nodes: No imaged thoracic adenopathy. Moderate hiatal
hernia, with approximately [DATE] of the stomach in the lower chest.

Lungs/Pleura: No pleural fluid.  Clear imaged lungs.

Upper Abdomen: Normal imaged portions of the liver, spleen.

Musculoskeletal: No acute osseous abnormality.
IMPRESSION: No acute findings in the imaged extracardiac chest.

Aortic Atherosclerosis (OPQDB-IWD.D).

Hiatal hernia.
FINDINGS: A 100 kV prospective scan was triggered in the descending thoracic
aorta at 111 HU's. Axial non-contrast 3 mm slices were carried out
through the heart. The data set was analyzed on a dedicated work
station and scored using the Agatson method. Gantry rotation speed
was 250 msecs and collimation was .6 mm. 0.8 mg of sl NTG was given.
The 3D data set was reconstructed in 5% intervals of the 35-75 % of
the R-R cycle. Diastolic phases were analyzed on a dedicated work
station using MPR, MIP and VRT modes. The patient received 80 cc of
contrast.

Coronary Arteries:  Normal coronary origin.  Codominance.

RCA is a large codominant artery that gives rise to PDA and PLA.
Calcified plaque in the proximal RCA causes 25-49% stenosis.
Calcified plaque in the mid RCA causes 25-49% stenosis.

Left main is a large artery that gives rise to LAD and LCX arteries.
Calcified plaque in the left main causes 0-24% stenosis

LAD is a large vessel. Calcified plaque in the proximal LAD causes
25-49% stenosis. Calcified plaque in D1 causes 0-24%.

LCX is a codominant artery that gives rise to one large OM1 branch.
Calcified plaque in the proximal LCX causes 25-49% stenosis.
Calcified plaque in the mid LCX causes 50-69% stenos Calcified
plaque in ostial OM1 causes 25-49% stenosis

Other findings:

Left Ventricle: Normal size

Left [facility]: Normal size

Pulmonary Veins: Normal configuration

Right Ventricle: Mild enlargement

Right [facility]: Normal size

Cardiac valves: Mild AV calcifications (AV calcium score 86). Mild
mitral annular calcifications.

Thoracic aorta: Normal size

Pulmonary Arteries: Normal size

Systemic Veins: Normal drainage

Pericardium: Normal thickness
IMPRESSION: 1. Coronary calcium score of 938. This was 97th percentile for age
and sex matched control.

2. Normal coronary origin with codominance.

3. Calcified plaque in the left main causes minimal (0-24%) stenosis

4. Calcified plaque in the proximal LAD causes mild (25-49%)
stenosis. Calcified plaque in D1 causes minimal (0-24%) stenosis.

5. Calcified plaque in the proximal LCX causes mild (25-49%)
stenosis. Calcified plaque in the mid LCX causes moderate (50-69%)
stenosis. Calcified plaque in ostial OM1 causes mild (25-49%)
stenosis

6. Calcified plaque in the proximal and mid RCA causes mild (25-49%)
stenosis.

7.  Mild aortic valve calcifications

8.  Will send study for CTFFR

CAD-RADS 3. Moderate stenosis. Consider symptom-guided anti-ischemic
pharmacotherapy as well as risk factor modification per guideline
directed care. Additional analysis with CT FFR will be submitted.

*** End of Addendum ***
EXAM:
OVER-READ INTERPRETATION  CT CHEST

The following report is an over-read performed by radiologist Dr.
Jan Benny Joost [[email or web address]] on 07/05/2021. This over-read
does not include interpretation of cardiac or coronary anatomy or
pathology. The coronary CTA interpretation by the cardiologist is
attached.
FINDINGS: Vascular: Aortic atherosclerosis. No central pulmonary embolism, on
this non-dedicated study.

Mediastinum/Nodes: No imaged thoracic adenopathy. Moderate hiatal
hernia, with approximately [DATE] of the stomach in the lower chest.

Lungs/Pleura: No pleural fluid.  Clear imaged lungs.

Upper Abdomen: Normal imaged portions of the liver, spleen.

Musculoskeletal: No acute osseous abnormality.
IMPRESSION: No acute findings in the imaged extracardiac chest.

Aortic Atherosclerosis (OPQDB-IWD.D).

Hiatal hernia.

## 2022-11-18 IMAGING — DX DG CHEST 1V PORT
1 series · 1 of 1 positions shown · non-contrast
Comparison: Chest radiograph July 03, 2021.

CLINICAL DATA: SOB (shortness of breath) C2S.21 (91D-QG-CM)

EXAM:
PORTABLE CHEST 1 VIEW

[chest]
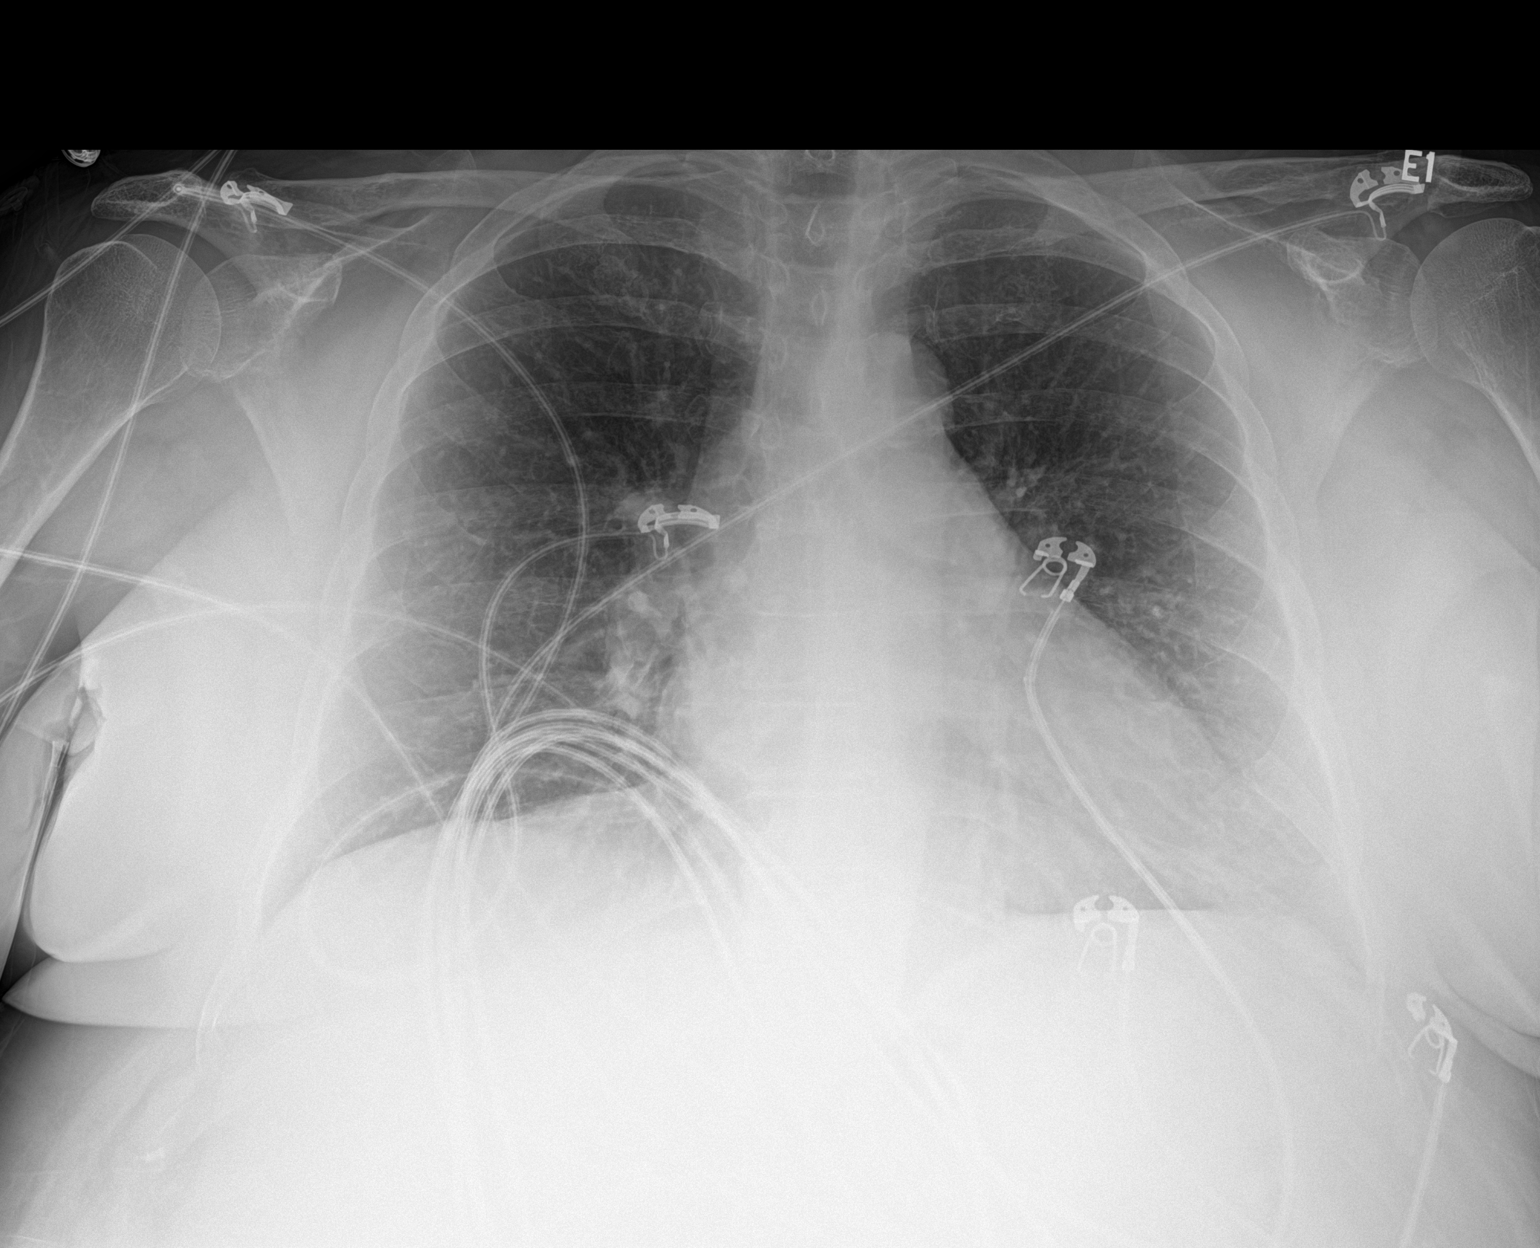

[1 of 1 positions shown; findings below may reference images not displayed]

FINDINGS: Streaky left basilar opacities. No visible pleural effusions or
pneumothorax. Cardiomediastinal silhouette is accentuated by AP
technique.
IMPRESSION: Streaky left basilar opacities, favor atelectasis.

## 2023-02-27 ENCOUNTER — Other Ambulatory Visit: Payer: Self-pay | Admitting: Gastroenterology

## 2023-03-15 ENCOUNTER — Encounter (HOSPITAL_COMMUNITY): Payer: Self-pay | Admitting: Gastroenterology

## 2023-03-15 NOTE — Progress Notes (Signed)
Attempted to obtain medical history for pre op call via telephone, unable to reach at this time. HIPAA compliant voicemail message left requesting return call to pre surgical testing department.

## 2023-03-18 NOTE — Progress Notes (Signed)
Pre op call eval Name:Kathleen Helene Kelp PA Cardiology- Alvino Chapel MD -[facility]   EKG-12/11/21 -[facility] Echo-10/17/21-[facility]  Cath-n/a Stress-n/a ICD/PM- n/a Blood thinner- Xarelto hold starting 11/19 GLP-1- n/a  Hx:CAD, HTN,Afib s/p ablation,HLD. Was seeing [facility]ology [facility] but now seeing Dr. Alvino Chapel with [facility], last office visit 03/14/23. At that appt discussed some chest pains shes been having. They did start her on Imdur, recommended a stress test and if abnormal needs cath. Stress test is set for 12/3. Patient does endorse has had some relief from the Imdur.  Anesthesia Review: Yes

## 2023-03-26 ENCOUNTER — Other Ambulatory Visit (HOSPITAL_COMMUNITY): Payer: Self-pay | Admitting: Psychiatry

## 2023-04-05 ENCOUNTER — Encounter (HOSPITAL_COMMUNITY): Payer: Self-pay | Admitting: Gastroenterology

## 2023-04-08 ENCOUNTER — Other Ambulatory Visit: Payer: Self-pay | Admitting: Gastroenterology

## 2023-04-12 ENCOUNTER — Ambulatory Visit (HOSPITAL_BASED_OUTPATIENT_CLINIC_OR_DEPARTMENT_OTHER): Payer: PRIVATE HEALTH INSURANCE | Admitting: Certified Registered"

## 2023-04-12 ENCOUNTER — Encounter (HOSPITAL_COMMUNITY): Payer: Self-pay | Admitting: Gastroenterology

## 2023-04-12 ENCOUNTER — Ambulatory Visit (HOSPITAL_COMMUNITY): Payer: PRIVATE HEALTH INSURANCE | Admitting: Certified Registered"

## 2023-04-12 ENCOUNTER — Other Ambulatory Visit: Payer: Self-pay

## 2023-04-12 ENCOUNTER — Encounter (HOSPITAL_COMMUNITY)
Admission: RE | Disposition: A | Discharge status: Home / Self Care | Payer: Self-pay | Source: Home / Self Care | Attending: Gastroenterology

## 2023-04-12 ENCOUNTER — Ambulatory Visit (HOSPITAL_COMMUNITY)
Admission: RE | Admit: 2023-04-12 | Discharge: 2023-04-12 | Disposition: A | Discharge status: Home / Self Care | Payer: PRIVATE HEALTH INSURANCE | Attending: Gastroenterology | Admitting: Gastroenterology

## 2023-04-12 DIAGNOSIS — K317 Polyp of stomach and duodenum: Secondary | ICD-10-CM

## 2023-04-12 DIAGNOSIS — Z7901 Long term (current) use of anticoagulants: Secondary | ICD-10-CM | POA: Insufficient documentation

## 2023-04-12 DIAGNOSIS — I10 Essential (primary) hypertension: Secondary | ICD-10-CM | POA: Diagnosis not present

## 2023-04-12 DIAGNOSIS — I4891 Unspecified atrial fibrillation: Secondary | ICD-10-CM | POA: Diagnosis not present

## 2023-04-12 DIAGNOSIS — K2091 Esophagitis, unspecified with bleeding: Secondary | ICD-10-CM | POA: Diagnosis not present

## 2023-04-12 DIAGNOSIS — I2510 Atherosclerotic heart disease of native coronary artery without angina pectoris: Secondary | ICD-10-CM | POA: Diagnosis not present

## 2023-04-12 DIAGNOSIS — G4730 Sleep apnea, unspecified: Secondary | ICD-10-CM | POA: Diagnosis not present

## 2023-04-12 DIAGNOSIS — K449 Diaphragmatic hernia without obstruction or gangrene: Secondary | ICD-10-CM | POA: Diagnosis not present

## 2023-04-12 DIAGNOSIS — D120 Benign neoplasm of cecum: Secondary | ICD-10-CM

## 2023-04-12 DIAGNOSIS — K3189 Other diseases of stomach and duodenum: Secondary | ICD-10-CM | POA: Diagnosis not present

## 2023-04-12 DIAGNOSIS — D509 Iron deficiency anemia, unspecified: Secondary | ICD-10-CM

## 2023-04-12 HISTORY — PX: ESOPHAGOGASTRODUODENOSCOPY: SHX5428

## 2023-04-12 HISTORY — PX: BIOPSY: SHX5522

## 2023-04-12 HISTORY — PX: POLYPECTOMY: SHX5525

## 2023-04-12 HISTORY — PX: COLONOSCOPY WITH PROPOFOL: SHX5780

## 2023-04-12 SURGERY — COLONOSCOPY WITH PROPOFOL
Anesthesia: Monitor Anesthesia Care

## 2023-04-12 MED ORDER — PROPOFOL 500 MG/50ML IV EMUL
INTRAVENOUS | Status: AC
  Filled 2023-04-12: qty 50

## 2023-04-12 MED ORDER — PROPOFOL 500 MG/50ML IV EMUL
INTRAVENOUS | Status: DC | PRN
  Administered 2023-04-12: 135 ug/kg/min via INTRAVENOUS

## 2023-04-12 MED ORDER — PROPOFOL 10 MG/ML IV BOLUS
INTRAVENOUS | Status: DC | PRN
  Administered 2023-04-12: 50 mg via INTRAVENOUS

## 2023-04-12 MED ORDER — PROPOFOL 1000 MG/100ML IV EMUL
INTRAVENOUS | Status: AC
  Filled 2023-04-12: qty 100

## 2023-04-12 MED ORDER — SODIUM CHLORIDE 0.9 % IV SOLN: INTRAVENOUS | Status: DC | PRN

## 2023-04-12 SURGICAL SUPPLY — 20 items

## 2023-04-12 NOTE — H&P (Signed)
Kathleen Sellers HPI: For the past one month the patient reports feeling fatigued.  She was evaluated by caridology at [facility] and it was not determined to be a cardiac source.  Routine blood work was obtained and it showed that she has a microcytic anemia:  9.4 g/dL MCV 16.1.  Prior to this time her HGB was at 11.5 (07/17/2022 and 14.2 g/dL 0/9604.  The 02/19/2023 ferritin was 9 with an iron saturation of 3%.  The patient has a history of afib and she was started on Xarelto 1.5-2 years ago and she is s/p ablation 09/2021.  Two weeks ago she reported melenic stool and some hematochezia.  She denies any issues with abdominal pain, nausea, or vomiting.  The patient does continue with omeprazole.  On 11/23/2016 she was diagnosed with an LA Grade B esophagitis.  The colonoscopy at that time was positive for 3 adenomas  Past Medical History:  Diagnosis Date   CAD (coronary artery disease)    Cerebral atrophy (HCC) 02/07/2011   done to assess head trauma ( [facility])   Colon polyps 2005   Depression    Hyperglycemia    Hyperlipidemia    Hypertension    Hypothyroidism    Sleep apnea with hypoxia 07/16/2021    Past Surgical History:  Procedure Laterality Date   APPENDECTOMY  1970   with USO   CARDIAC CATHETERIZATION  2003   CATARACT EXTRACTION     Dr Nile Riggs   CHOLECYSTECTOMY  2005   COLONOSCOPY W/ POLYPECTOMY  2006   Dr. Evette Cristal   Hysterectomy & USO  1995   NASAL SINUS SURGERY  1988   Dr. Haroldine Laws   OOPHORECTOMY  1970   benign tumor    Family History  Problem Relation Age of Onset   Stroke Father        cerebral hemorrhage   Diabetes Mother    Hypertension Mother    Lung cancer Mother        with bone metastases   Coronary artery disease Paternal Uncle    Breast cancer Maternal Grandmother    Heart attack Paternal Grandfather        in 29s    Social History:  reports that she has never smoked. She has never used smokeless tobacco. She reports that she does not drink  alcohol and does not use drugs.  Allergies:  Allergies  Allergen Reactions   Sulfamethoxazole-Trimethoprim Rash        Sulfonamide Derivatives Rash   Cefaclor Itching and Rash    Medications: Scheduled: Continuous:  No results found for this or any previous visit (from the past 24 hours).   No results found.  ROS:  As stated above in the HPI otherwise negative.  Height 5\' 4"  (1.626 m), weight 90.7 kg.    PE: Gen: NAD, Alert and Oriented HEENT:  [place]/AT, EOMI Neck: Supple, no LAD Lungs: CTA Bilaterally CV: RRR without M/G/R ABD: Soft, NTND, +BS Ext: No C/C/E  Assessment/Plan: 1) IDA - EGD/colonoscopy.  Armani Brar D 04/12/2023, 7:28 AM

## 2023-04-12 NOTE — Op Note (Signed)
[facility] Patient Name: Kathleen Sellers Procedure Date: 04/12/2023 MRN: [id number] Attending MD: Jeani Hawking , MD, [id number] Date of Birth: 05/04/1950 CSN: [id number] Age: 72 Admit Type: Outpatient Procedure:                Upper GI endoscopy Indications:              Iron deficiency anemia Providers:                Jeani Hawking, MD, Fransisca Connors, Alan Ripper,                            Technician Referring MD:             Jeani Hawking, MD Medicines:                Propofol per Anesthesia Complications:            No immediate complications. Estimated Blood Loss:     Estimated blood loss: none. Procedure:                Pre-Anesthesia Assessment:                           - Prior to the procedure, a History and Physical                            was performed, and patient medications and                            allergies were reviewed. The patient's tolerance of                            previous anesthesia was also reviewed. The risks                            and benefits of the procedure and the sedation                            options and risks were discussed with the patient.                            All questions were answered, and informed consent                            was obtained. Prior Anticoagulants: The patient has                            taken no anticoagulant or antiplatelet agents. ASA                            Grade Assessment: III - A patient with severe                            systemic disease. After reviewing the risks and  benefits, the patient was deemed in satisfactory                            condition to undergo the procedure.                           - Sedation was administered by an anesthesia                            professional. Deep sedation was attained.                           After obtaining informed consent, the endoscope was                            passed under direct  vision. Throughout the                            procedure, the patient's blood pressure, pulse, and                            oxygen saturations were monitored continuously. The                            PCF-HQ190L ([id number]) Olympus colonoscope was                            introduced through the mouth, and advanced to the                            second part of duodenum. The upper GI endoscopy was                            accomplished without difficulty. The patient                            tolerated the procedure well. Scope In: Scope Out: Findings:      A 3 cm hiatal hernia was present.      Multiple 2 to 4 mm sessile polyps with no bleeding and no stigmata of       recent bleeding were found in the gastric body. The polyp was removed       with a cold snare. Resection and retrieval were complete.      Diffuse mildly erythematous mucosa without bleeding was found in the       gastric body. Biopsies were taken with a cold forceps for Helicobacter       pylori testing.      The examined duodenum was normal. Impression:               - 3 cm hiatal hernia.                           - Multiple gastric polyps. Resected and retrieved.                           -  Erythematous mucosa in the gastric body. Biopsied.                           - Normal examined duodenum. Moderate Sedation:      Not Applicable - Patient had care per Anesthesia. Recommendation:           - Patient has a contact number available for                            emergencies. The signs and symptoms of potential                            delayed complications were discussed with the                            patient. Return to normal activities tomorrow.                            Written discharge instructions were provided to the                            patient.                           - Resume previous diet.                           - Continue present medications.                           -  Await pathology results. Procedure Code(s):        --- Professional ---                           [phone], Esophagogastroduodenoscopy, flexible,                            transoral; with removal of tumor(s), polyp(s), or                            other lesion(s) by snare technique                           43239, 59, Esophagogastroduodenoscopy, flexible,                            transoral; with biopsy, single or multiple Diagnosis Code(s):        --- Professional ---                           K31.7, Polyp of stomach and duodenum                           K44.9, Diaphragmatic hernia without obstruction or                            gangrene  K31.89, Other diseases of stomach and duodenum                           D50.9, Iron deficiency anemia, unspecified CPT copyright 2022 American Medical Association. All rights reserved. The codes documented in this report are preliminary and upon coder review may  be revised to meet current compliance requirements. Jeani Hawking, MD Jeani Hawking, MD 04/12/2023 9:02:32 AM This report has been signed electronically. Number of Addenda: 0

## 2023-04-12 NOTE — Op Note (Signed)
[facility] Patient Name: Kathleen Sellers Procedure Date: 04/12/2023 MRN: [id number] Attending MD: Jeani Hawking , MD, [id number] Date of Birth: 1950-07-14 CSN: [id number] Age: 72 Admit Type: Outpatient Procedure:                Colonoscopy Indications:              Iron deficiency anemia Providers:                Jeani Hawking, MD, Fransisca Connors, Alan Ripper,                            Technician Referring MD:             Jeani Hawking, MD Medicines:                Propofol per Anesthesia Complications:            No immediate complications. Estimated Blood Loss:     Estimated blood loss: none. Procedure:                Pre-Anesthesia Assessment:                           - Prior to the procedure, a History and Physical                            was performed, and patient medications and                            allergies were reviewed. The patient's tolerance of                            previous anesthesia was also reviewed. The risks                            and benefits of the procedure and the sedation                            options and risks were discussed with the patient.                            All questions were answered, and informed consent                            was obtained. Prior Anticoagulants: The patient has                            taken no anticoagulant or antiplatelet agents. ASA                            Grade Assessment: III - A patient with severe                            systemic disease. After reviewing the risks and  benefits, the patient was deemed in satisfactory                            condition to undergo the procedure.                           - Sedation was administered by an anesthesia                            professional. Deep sedation was attained.                           After obtaining informed consent, the colonoscope                            was passed under direct  vision. Throughout the                            procedure, the patient's blood pressure, pulse, and                            oxygen saturations were monitored continuously. The                            PCF-HQ190L ([id number]) Olympus colonoscope was                            introduced through the anus and advanced to the the                            cecum, identified by appendiceal orifice and                            ileocecal valve. The colonoscopy was performed                            without difficulty. The patient tolerated the                            procedure well. The quality of the bowel                            preparation was evaluated using the BBPS [facility]                            Bowel Preparation Scale) with scores of: Right                            Colon = 3, Transverse Colon = 3 and Left Colon = 3                            (entire mucosa seen well with no residual staining,  small fragments of stool or opaque liquid). The                            total BBPS score equals 9. The ileocecal valve,                            appendiceal orifice, and rectum were photographed. Scope In: 8:32:18 AM Scope Out: 8:48:38 AM Scope Withdrawal Time: 0 hours 13 minutes 26 seconds  Total Procedure Duration: 0 hours 16 minutes 20 seconds  Findings:      Two sessile polyps were found in the cecum. The polyps were 2 to 3 mm in       size. These polyps were removed with a cold snare. Resection and       retrieval were complete. Impression:               - Two 2 to 3 mm polyps in the cecum, removed with a                            cold snare. Resected and retrieved. Moderate Sedation:      Not Applicable - Patient had care per Anesthesia. Recommendation:           - Patient has a contact number available for                            emergencies. The signs and symptoms of potential                            delayed complications were  discussed with the                            patient. Return to normal activities tomorrow.                            Written discharge instructions were provided to the                            patient.                           - Resume previous diet.                           - Continue present medications.                           - Await pathology results.                           - Repeat colonoscopy is not recommended for                            surveillance. Procedure Code(s):        --- Professional ---                           [phone], Colonoscopy,  flexible; with removal of                            tumor(s), polyp(s), or other lesion(s) by snare                            technique Diagnosis Code(s):        --- Professional ---                           D12.0, Benign neoplasm of cecum                           D50.9, Iron deficiency anemia, unspecified CPT copyright 2022 American Medical Association. All rights reserved. The codes documented in this report are preliminary and upon coder review may  be revised to meet current compliance requirements. Jeani Hawking, MD Jeani Hawking, MD 04/12/2023 9:05:59 AM This report has been signed electronically. Number of Addenda: 0

## 2023-04-12 NOTE — Discharge Instructions (Signed)

## 2023-04-12 NOTE — Transfer of Care (Signed)
Immediate Anesthesia Transfer of Care Note  Patient: Kathleen Sellers  Procedure(s) Performed: COLONOSCOPY WITH PROPOFOL ESOPHAGOGASTRODUODENOSCOPY (EGD) BIOPSY POLYPECTOMY  Patient Location: PACU  Anesthesia Type:MAC  Level of Consciousness: drowsy  Airway & Oxygen Therapy: Patient Spontanous Breathing and Patient connected to face mask oxygen  Post-op Assessment: Report given to RN, Post -op Vital signs reviewed and stable, and Patient moving all extremities X 4  Post vital signs: Reviewed and stable  Last Vitals:  Vitals Value Taken Time  BP 112/69   Temp    Pulse 78   Resp 12   SpO2 98     Last Pain:  Vitals:   04/12/23 0739  TempSrc: Temporal  PainSc: 0-No pain         Complications: No notable events documented.

## 2023-04-12 NOTE — Anesthesia Postprocedure Evaluation (Signed)
Anesthesia Post Note  Patient: Kathleen Sellers  Procedure(s) Performed: COLONOSCOPY WITH PROPOFOL ESOPHAGOGASTRODUODENOSCOPY (EGD) BIOPSY POLYPECTOMY     Patient location during evaluation: PACU Anesthesia Type: MAC Level of consciousness: awake and alert Pain management: pain level controlled Vital Signs Assessment: post-procedure vital signs reviewed and stable Respiratory status: spontaneous breathing, nonlabored ventilation, respiratory function stable and patient connected to nasal cannula oxygen Cardiovascular status: stable and blood pressure returned to baseline Postop Assessment: no apparent nausea or vomiting Anesthetic complications: no   No notable events documented.  Last Vitals:  Vitals:   04/12/23 0910 04/12/23 0916  BP: 124/63 (!) 125/59  Pulse: 69 63  Resp: 18 14  Temp:    SpO2: 95% 97%    Last Pain:  Vitals:   04/12/23 0916  TempSrc:   PainSc: 0-No pain                 Nelle Don Blanca Thornton

## 2023-04-12 NOTE — Anesthesia Preprocedure Evaluation (Signed)
Anesthesia Evaluation  Patient identified by MRN, date of birth, ID band Patient awake    Reviewed: Allergy & Precautions, NPO status , Patient's Chart, lab work & pertinent test results  Airway Mallampati: II  TM Distance: >3 FB Neck ROM: Full    Dental no notable dental hx.    Pulmonary sleep apnea and Continuous Positive Airway Pressure Ventilation    Pulmonary exam normal        Cardiovascular hypertension, + CAD   Rhythm:Regular Rate:Normal     Neuro/Psych    Depression    negative neurological ROS     GI/Hepatic Neg liver ROS,GERD  Medicated,,  Endo/Other  Hypothyroidism    Renal/GU negative Renal ROS  negative genitourinary   Musculoskeletal negative musculoskeletal ROS (+)    Abdominal Normal abdominal exam  (+)   Peds  Hematology   Anesthesia Other Findings   Reproductive/Obstetrics                             Anesthesia Physical Anesthesia Plan  ASA: 3  Anesthesia Plan: MAC   Post-op Pain Management:    Induction: Intravenous  PONV Risk Score and Plan: 2 and Propofol infusion and Treatment may vary due to age or medical condition  Airway Management Planned: Simple Face Mask and Nasal Cannula  Additional Equipment: None  Intra-op Plan:   Post-operative Plan:   Informed Consent: I have reviewed the patients History and Physical, chart, labs and discussed the procedure including the risks, benefits and alternatives for the proposed anesthesia with the patient or authorized representative who has indicated his/her understanding and acceptance.     Dental advisory given  Plan Discussed with: CRNA  Anesthesia Plan Comments:        Anesthesia Quick Evaluation

## 2023-04-12 NOTE — Anesthesia Procedure Notes (Signed)
Procedure Name: MAC Date/Time: 04/12/2023 8:19 AM  Performed by: Nelle Don, CRNAPre-anesthesia Checklist: Patient identified, Emergency Drugs available, Suction available and Patient being monitored Oxygen Delivery Method: Simple face mask

## 2023-04-15 ENCOUNTER — Encounter (HOSPITAL_COMMUNITY): Payer: Self-pay | Admitting: Gastroenterology

## 2023-04-16 LAB — SURGICAL PATHOLOGY
